# Patient Record
Sex: Female | Born: 1985 | Race: White | Smoking: Never smoker
Health system: Southern US, Community
[De-identification: ages and names within clinical notes are randomized; demographics above are authoritative.]

## PROBLEM LIST (undated history)

## (undated) DIAGNOSIS — F418 Other specified anxiety disorders: Secondary | ICD-10-CM

## (undated) DIAGNOSIS — S93402A Sprain of unspecified ligament of left ankle, initial encounter: Secondary | ICD-10-CM

## (undated) HISTORY — PX: KNEE ARTHROSCOPY: SUR90

## (undated) HISTORY — PX: TUBAL LIGATION: SHX77

## (undated) HISTORY — DX: Sprain of unspecified ligament of left ankle, initial encounter: S93.402A

## (undated) HISTORY — PX: TONSILLECTOMY: SUR1361

## (undated) HISTORY — PX: OTHER SURGICAL HISTORY: SHX169

## (undated) HISTORY — DX: Other specified anxiety disorders: F41.8

---

## 2018-08-26 ENCOUNTER — Encounter: Payer: Self-pay | Admitting: Osteopathic Medicine

## 2018-08-26 ENCOUNTER — Ambulatory Visit (INDEPENDENT_AMBULATORY_CARE_PROVIDER_SITE_OTHER): Payer: BLUE CROSS/BLUE SHIELD | Admitting: Osteopathic Medicine

## 2018-08-26 VITALS — BP 123/73 | HR 84 | Temp 97.8°F | Ht 65.0 in | Wt 160.0 lb

## 2018-08-26 DIAGNOSIS — F419 Anxiety disorder, unspecified: Secondary | ICD-10-CM

## 2018-08-26 DIAGNOSIS — F418 Other specified anxiety disorders: Secondary | ICD-10-CM | POA: Diagnosis not present

## 2018-08-26 DIAGNOSIS — F329 Major depressive disorder, single episode, unspecified: Secondary | ICD-10-CM | POA: Diagnosis not present

## 2018-08-26 DIAGNOSIS — S93402A Sprain of unspecified ligament of left ankle, initial encounter: Secondary | ICD-10-CM | POA: Insufficient documentation

## 2018-08-26 DIAGNOSIS — S93492A Sprain of other ligament of left ankle, initial encounter: Secondary | ICD-10-CM | POA: Diagnosis not present

## 2018-08-26 HISTORY — DX: Sprain of unspecified ligament of left ankle, initial encounter: S93.402A

## 2018-08-26 HISTORY — DX: Other specified anxiety disorders: F41.8

## 2018-08-26 LAB — LIPID PANEL
Cholesterol: 148 mg/dL (ref <200)
HDL: 66 mg/dL (ref >=50)
LDL Cholesterol (Calc): 67 mg/dL (calc)
Non-HDL Cholesterol (Calc): 82 mg/dL (calc) (ref <130)
Total CHOL/HDL Ratio: 2.2 (calc) (ref <5.0)
Triglycerides: 66 mg/dL (ref <150)

## 2018-08-26 LAB — COMPLETE METABOLIC PANEL WITH GFR
AG Ratio: 1.8 (calc) (ref 1.0–2.5)
ALKALINE PHOSPHATASE (APISO): 55 U/L (ref 31–125)
ALT: 32 U/L — ABNORMAL HIGH (ref 6–29)
AST: 25 U/L (ref 10–30)
Albumin: 4.6 g/dL (ref 3.6–5.1)
BILIRUBIN TOTAL: 0.8 mg/dL (ref 0.2–1.2)
BUN: 13 mg/dL (ref 7–25)
CO2: 27 mmol/L (ref 20–32)
Calcium: 9.1 mg/dL (ref 8.6–10.2)
Chloride: 102 mmol/L (ref 98–110)
Creat: 0.67 mg/dL (ref 0.50–1.10)
GFR, Est African American: 135 mL/min/{1.73_m2} (ref >=60)
GFR, Est Non African American: 116 mL/min/{1.73_m2} (ref >=60)
Globulin: 2.5 g/dL (calc) (ref 1.9–3.7)
Glucose, Bld: 91 mg/dL (ref 65–99)
Potassium: 3.9 mmol/L (ref 3.5–5.3)
SODIUM: 138 mmol/L (ref 135–146)
Total Protein: 7.1 g/dL (ref 6.1–8.1)

## 2018-08-26 LAB — CBC
HCT: 40.9 % (ref 35.0–45.0)
Hemoglobin: 14 g/dL (ref 11.7–15.5)
MCH: 30.6 pg (ref 27.0–33.0)
MCHC: 34.2 g/dL (ref 32.0–36.0)
MCV: 89.3 fL (ref 80.0–100.0)
MPV: 9.1 fL (ref 7.5–12.5)
Platelets: 285 10*3/uL (ref 140–400)
RBC: 4.58 10*6/uL (ref 3.80–5.10)
RDW: 12.7 % (ref 11.0–15.0)
WBC: 9.8 10*3/uL (ref 3.8–10.8)

## 2018-08-26 LAB — TSH: TSH: 1.05 mIU/L

## 2018-08-26 MED ORDER — ESCITALOPRAM OXALATE 10 MG PO TABS: 10.0000 mg | ORAL_TABLET | Freq: Every day | ORAL | 1 refills | Status: DC

## 2018-08-26 MED ORDER — IBUPROFEN 800 MG PO TABS: 800.0000 mg | ORAL_TABLET | Freq: Three times a day (TID) | ORAL | 2 refills | Status: DC | PRN

## 2018-08-26 NOTE — Patient Instructions (Signed)
We are starting a medication today called Lexapro to help treat your anxiety as well as depression. This is a daily medication to help control your symptoms. Try taking this in the evenings.   I also highly encourage my patients who are suffering from anxiety to seek care with a counselor or therapist. A therapist can coach you in techniques to recognize and deal with troubling thought patterns and behaviors. The ability to cope with external stressors is crucial to overall mental health. I will look into online/phone resources and get back to you on this!   Expect a call or message form this office in the next 2 weeks to check in: If you're doing well on the medicine but not feeling any effect, we can increase the dose. If you're starting to feel some effect/improvement, we can hold off on a dose increase and reevaluate at your office visit.   Let's plan to follow up in the office in 4 weeks. At that time, we can talk about how well the medicine is working for you, and we can consider increasing the dose, adding another medicine, etc.   If we are having trouble finding a good medication regimen for you, we can consult with a psychiatrist to assist with medication management.   If you experience problematic side effects, please let me know ASAP - we can switch the medicine any time, and we don't need an appointment for this.   For immediate mental health services:  [facility], [address] [place], [place] [address], [phone]  [facility], [address], [place], [place] [address], [phone]  Any emergency room  National Suicide Prevention Lifeline, [phone]   Any questions or concerns, call me!
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
## 2018-09-16 ENCOUNTER — Encounter: Payer: Self-pay | Admitting: Osteopathic Medicine

## 2018-09-16 MED ORDER — ESCITALOPRAM OXALATE 20 MG PO TABS: 20.0000 mg | ORAL_TABLET | Freq: Every day | ORAL | 0 refills | Status: DC

## 2018-09-30 ENCOUNTER — Ambulatory Visit: Payer: Self-pay | Admitting: Osteopathic Medicine

## 2018-09-30 ENCOUNTER — Ambulatory Visit: Payer: BLUE CROSS/BLUE SHIELD | Admitting: Family Medicine

## 2018-09-30 ENCOUNTER — Other Ambulatory Visit: Payer: Self-pay

## 2018-09-30 ENCOUNTER — Encounter: Payer: Self-pay | Admitting: Family Medicine

## 2018-09-30 ENCOUNTER — Ambulatory Visit (INDEPENDENT_AMBULATORY_CARE_PROVIDER_SITE_OTHER): Payer: BLUE CROSS/BLUE SHIELD

## 2018-09-30 VITALS — BP 122/79 | HR 64 | Temp 98.2°F | Wt 161.0 lb

## 2018-09-30 DIAGNOSIS — M25572 Pain in left ankle and joints of left foot: Secondary | ICD-10-CM | POA: Diagnosis not present

## 2018-09-30 DIAGNOSIS — X58XXXA Exposure to other specified factors, initial encounter: Secondary | ICD-10-CM | POA: Diagnosis not present

## 2018-09-30 DIAGNOSIS — S82892A Other fracture of left lower leg, initial encounter for closed fracture: Secondary | ICD-10-CM | POA: Diagnosis not present

## 2018-09-30 DIAGNOSIS — S82832A Other fracture of upper and lower end of left fibula, initial encounter for closed fracture: Secondary | ICD-10-CM

## 2018-09-30 NOTE — Progress Notes (Signed)
Liel Lerer is a 33 y.o. female who presents to [facility] today for left ankle pain.  Dayanira suffered an ankle inversion injury about 5 weeks ago.  She had pain and swelling at the anterior lateral aspect of the ankle. She did some limited home treatment with compression, ice and elevation along with ibuprofen. She notes continued ankle pain and swelling especially at the lateral anterior ankle.  She notes that prolonged sitting makes her ankle swell and hurt more. She works as a [employer].  She denies any fever, chills, NVD.     ROS:  As above  Exam:  BP 122/79   Pulse 64   Temp 98.2 F (36.8 C) (Oral)   Wt 161 lb (73 kg)   LMP 09/09/2018   BMI 26.79 kg/m  Wt Readings from Last 5 Encounters:  09/30/18 161 lb (73 kg)  08/26/18 160 lb (72.6 kg)   General: Well Developed, well nourished, and in no acute distress.  Neuro/Psych: Alert and oriented x3, extra-ocular muscles intact, able to move all 4 extremities, sensation grossly intact. Skin: Warm and dry, no rashes noted.  Respiratory: Not using accessory muscles, speaking in full sentences, trachea midline.  Cardiovascular: Pulses palpable, no extremity edema. Abdomen: Does not appear distended. MSK: Left ankle Minimal swelling at ATFL area. Otherwise normal appearing.  Normal ROM TTP ATFL area.  Intact strength.  Laxity and pain to anterior drawer and talar tilt.   Pulses and capillary refill intact into the foot.  Foot is nontender with normal motion.  Lab and Radiology Results No results found for this or any previous visit (from the past 72 hour(s)). Dg Ankle Complete Left  Result Date: 09/30/2018 CLINICAL DATA:  33 year old female with a history of ankle injury 5 weeks ago and lateral ankle pain EXAM: LEFT ANKLE COMPLETE - 3+ VIEW COMPARISON:  None. FINDINGS: The AP and oblique images demonstrate irregularity at the distal fibula. Ankle mortise is congruent. No  radiopaque foreign body. Joint effusion on the lateral view. Mild soft tissue swelling at the lateral ankle. IMPRESSION: Nondisplaced fracture of the distal fibula with small joint effusion. Electronically Signed   By: Gilmer Mor D.O.   On: 09/30/2018 09:00  I personally (independently) visualized and performed the interpretation of the images attached in this note.  X-ray findings personally and independently reviewed.  Patient has tiny sliver of calcification consistent with tiny avulsion fracture.  This is not the classic ankle fracture appearance per my interpretation.    Assessment and Plan: 33 y.o. female with ankle sprain or avulsion fracture at ATFL.  Plan for compression and home exercise program.  Advance activity as tolerated and recheck in 3 to 4 weeks via WebEx meeting or in person if needed.  Patient informed of results. PDMP not reviewed this encounter. Orders Placed This Encounter  Procedures  . DG Ankle Complete Left    Standing Status:   Future    Number of Occurrences:   1    Standing Expiration Date:   11/30/2019    Order Specific Question:   Reason for Exam (SYMPTOM  OR DIAGNOSIS REQUIRED)    Answer:   eval left lateral ankle pain injury 5 week ago    Order Specific Question:   Is patient pregnant?    Answer:   No    Order Specific Question:   Preferred imaging location?    Answer:   [facility]    Order Specific Question:   Radiology  Contrast Protocol - do NOT remove file path    Answer:   [email or web address]   No orders of the defined types were placed in this encounter.   Historical information moved to improve visibility of documentation.  Past Medical History:  Diagnosis Date  . Depression with anxiety 08/26/2018  . Left ankle sprain 08/26/2018   Past Surgical History:  Procedure Laterality Date  . KNEE ARTHROSCOPY Bilateral   . r hand    . TONSILLECTOMY    . TUBAL LIGATION     Social History    Tobacco Use  . Smoking status: Never Smoker  . Smokeless tobacco: Never Used  Substance Use Topics  . Alcohol use: Yes    Alcohol/week: 2.0 standard drinks    Types: 2 Glasses of wine per week    Frequency: Never   family history includes Breast cancer in her paternal aunt and sister; Hypertension in her maternal grandmother; Stroke in her maternal grandmother.  Medications: Current Outpatient Medications  Medication Sig Dispense Refill  . escitalopram (LEXAPRO) 20 MG tablet Take 1 tablet (20 mg total) by mouth daily. 90 tablet 0  . ibuprofen (ADVIL,MOTRIN) 800 MG tablet Take 1 tablet (800 mg total) by mouth every 8 (eight) hours as needed. 60 tablet 2   No current facility-administered medications for this visit.    Allergies  Allergen Reactions  . Codeine Other (See Comments)    Mental status      Discussed warning signs or symptoms. Please see discharge instructions. Patient expresses understanding.

## 2018-09-30 NOTE — Patient Instructions (Addendum)
Thank you for coming in today. Recheck in 3 weeks or so Via WebEx.   Work on ankle exercises.   Consider compression sleeve or stocking.   Consider Body Helix full ankle.    Ankle Sprain, Phase I Rehab Ask your health care provider which exercises are safe for you. Do exercises exactly as told by your health care provider and adjust them as directed. It is normal to feel mild stretching, pulling, tightness, or discomfort as you do these exercises, but you should stop right away if you feel sudden pain or your pain gets worse.Do not begin these exercises until told by your health care provider. Stretching and range of motion exercises These exercises warm up your muscles and joints and improve the movement and flexibility of your lower leg and ankle. These exercises also help to relieve pain and stiffness. Exercise A: Gastroc and soleus stretch  1. Sit on the floor with your left / right leg extended. 2. Loop a belt or towel around the ball of your left / right foot. The ball of your foot is on the walking surface, right under your toes. 3. Keep your left / right ankle and foot relaxed and keep your knee straight while you use the belt or towel to pull your foot toward you. You should feel a gentle stretch behind your calf or knee. 4. Hold this position for __________ seconds, then release to the starting position. Repeat the exercise with your knee bent. You can put a pillow or a rolled bath towel under your knee to support it. You should feel a stretch deep in your calf or at your Achilles tendon. Repeat each stretch __________ times. Complete these stretches __________ times a day. Exercise B: Ankle alphabet  1. Sit with your left / right leg supported at the lower leg. ? Do not rest your foot on anything. ? Make sure your foot has room to move freely. 2. Think of your left / right foot as a paintbrush, and move your foot to trace each letter of the alphabet in the air. Keep your hip  and knee still while you trace. Make the letters as large as you can without feeling discomfort. 3. Trace every letter from A to Z. Repeat __________ times. Complete this exercise __________ times a day. Strengthening exercises These exercises build strength and endurance in your ankle and lower leg. Endurance is the ability to use your muscles for a long time, even after they get tired. Exercise C: Dorsiflexors  1. Secure a rubber exercise band or tube to an object, such as a table leg, that will stay still when the band is pulled. Secure the other end around your left / right foot. 2. Sit on the floor facing the object, with your left / right leg extended. The band or tube should be slightly tense when your foot is relaxed. 3. Slowly bring your foot toward you, pulling the band tighter. 4. Hold this position for __________ seconds. 5. Slowly return your foot to the starting position. Repeat __________ times. Complete this exercise __________ times a day. Exercise D: Plantar flexors  1. Sit on the floor with your left / right leg extended. 2. Loop a rubber exercise tube or band around the ball of your left / right foot. The ball of your foot is on the walking surface, right under your toes. ? Hold the ends of the band or tube in your hands. ? The band or tube should be slightly tense when  your foot is relaxed. 3. Slowly point your foot and toes downward, pushing them away from you. 4. Hold this position for __________ seconds. 5. Slowly return your foot to the starting position. Repeat __________ times. Complete this exercise __________ times a day. Exercise E: Evertors 1. Sit on the floor with your legs straight out in front of you. 2. Loop a rubber exercise band or tube around the ball of your left / right foot. The ball of your foot is on the walking surface, right under your toes. ? Hold the ends of the band in your hands, or secure the band to a stable object. ? The band or tube  should be slightly tense when your foot is relaxed. 3. Slowly push your foot outward, away from your other leg. 4. Hold this position for __________ seconds. 5. Slowly return your foot to the starting position. Repeat __________ times. Complete this exercise __________ times a day. This information is not intended to replace advice given to you by your health care provider. Make sure you discuss any questions you have with your health care provider. Document Released: 01/18/2005 Document Revised: 12/04/2016 Document Reviewed: 05/03/2015 Elsevier Interactive Patient Education  2019 [employer]   Ankle Sprain, Phase II Rehab Ask your health care provider which exercises are safe for you. Do exercises exactly as told by your health care provider and adjust them as directed. It is normal to feel mild stretching, pulling, tightness, or discomfort as you do these exercises, but you should stop right away if you feel sudden pain or your pain gets worse.Do not begin these exercises until told by your health care provider. Stretching and range of motion exercises These exercises warm up your muscles and joints and improve the movement and flexibility of your lower leg and ankle. These exercises also help to relieve pain and stiffness. Exercise A: Gastroc stretch, standing  1. Stand with your hands against a wall. 2. Extend your left / right leg behind you, and bend your front knee slightly. Your heels should be on the floor. 3. Keeping your heels on the floor and your back knee straight, shift your weight toward the wall. You should feel a gentle stretch in the back of your lower leg (calf). 4. Hold this position for __________ seconds. Repeat __________ times. Complete this exercise __________ times a day. Exercise B: Soleus stretch, standing 1. Stand with your hands against a wall. 2. Extend your left / right leg behind you, and bend your front knee slightly. Both of your heels should be on the  floor. 3. Keeping your heels on the floor, bend your back knee and shift your weight slightly over your back leg. You should feel a gentle stretch deep in your calf. 4. Hold this position for __________ seconds. Repeat __________ times. Complete this exercise __________ times a day. Strengthening exercises These exercises build strength and endurance in your lower leg. Endurance is the ability to use your muscles for a long time, even after they get tired. Exercise C: Heel walking (dorsiflexion)  Walk on your heels for __________ seconds or ___________ ft. Keep your toes as high as possible. Repeat __________ times. Complete this exercise __________ times a day. Balance exercises These exercises improve your balance and the reaction and control of your ankle to help improve stability. Exercise D: Multi-angle lunge 1. Stand with your feet together. 2. Take a step forward with your left / right leg, and shift your weight onto that leg. Your back heel  will come off the floor, and your back toes will stay in place. 3. Push off your front leg to return your front foot to the starting position next to your other foot. 4. Repeat to the side, to the back, and any other directions as told by your health care provider. Repeat in each direction __________ times. Complete this exercise __________ times a day. Exercise E: Single leg stand 1. Without shoes, stand near a railing or in a door frame. Hold onto the railing or door frame as needed. 2. Stand on your left / right foot. Keep your big toe down on the floor and try to keep your arch lifted. 3. Hold this position for __________ seconds. Repeat __________ times. Complete this exercise __________ times a day. If this exercise is too easy, you can try it with your eyes closed or while standing on a pillow. Exercise F: Inversion/eversion  You will need a balance board for this exercise. Ask your health care provider where you can get a balance board or  how you can make one. 1. Stand on a non-carpeted surface near a countertop or wall. 2. Step onto the balance board so your feet are hip-width apart. 3. Keep your feet in place and keep your upper body and hips steady. Using only your feet and ankles to move the board, do one or both of the following exercises as told by your health care provider: ? Tip the board side to side as far as you can, alternating between tipping to the left and tipping to the right. If you can, tip the board so it silently taps the floor. Do not let the board forcefully hit the floor. From time to time, pause to hold a steady position. ? Tip the board side to side so the board does not hit the floor at all. From time to time, pause to hold a steady position. Repeat the movement for each exercise __________ times. Complete each exercise __________ times a day. Exercise G: Plantar flexion/dorsiflexion  You will need a balance board for this exercise. Ask your health care provider where you can get a balance board or how you can make one. 1. Stand on a non-carpeted surface near a countertop or wall. 2. Step onto the balance board so your feet are hip-width apart. 3. Keep your feet in place and keep your upper body and hips steady. Using only your feet and ankles to move the board, do one or both of the following exercises as told by your health care provider: ? Tip the board forward and backward so the board silently taps the floor. Do not let the board forcefully hit the floor. From time to time, pause to hold a steady position. ? Tip the board forward and backward so the board does not hit the floor at all. From time to time, pause to hold a steady position. Repeat the movement for each exercise __________ times. Complete each exercise __________ times a day. This information is not intended to replace advice given to you by your health care provider. Make sure you discuss any questions you have with your health care  provider. Document Released: 10/09/2005 Document Revised: 12/04/2016 Document Reviewed: 05/03/2015 Elsevier Interactive Patient Education  2019 [employer].

## 2018-12-08 ENCOUNTER — Other Ambulatory Visit: Payer: Self-pay | Admitting: Osteopathic Medicine

## 2018-12-08 NOTE — Telephone Encounter (Signed)
Forwarding to providing for review.

## 2018-12-09 NOTE — Telephone Encounter (Signed)
Needs follow-up appointment to recheck anxiety/mood on medication that we started earlier this year, virtual visit okay.  Okay to send a refill for 30 days.

## 2018-12-09 NOTE — Telephone Encounter (Signed)
Pt has been updated of med refill sent to local pharmacy. She is aware of provider's note & will return a call back to schedule a virtual appt w/provider. No other inquiries during call.

## 2019-01-09 ENCOUNTER — Encounter: Payer: Self-pay | Admitting: Osteopathic Medicine

## 2019-01-10 ENCOUNTER — Encounter: Payer: Self-pay | Admitting: Osteopathic Medicine

## 2019-01-10 ENCOUNTER — Ambulatory Visit (INDEPENDENT_AMBULATORY_CARE_PROVIDER_SITE_OTHER): Payer: BC Managed Care – PPO | Admitting: Osteopathic Medicine

## 2019-01-10 ENCOUNTER — Other Ambulatory Visit: Payer: Self-pay

## 2019-01-10 DIAGNOSIS — F329 Major depressive disorder, single episode, unspecified: Secondary | ICD-10-CM

## 2019-01-10 DIAGNOSIS — S82892A Other fracture of left lower leg, initial encounter for closed fracture: Secondary | ICD-10-CM

## 2019-01-10 DIAGNOSIS — F419 Anxiety disorder, unspecified: Secondary | ICD-10-CM

## 2019-01-10 MED ORDER — ESCITALOPRAM OXALATE 20 MG PO TABS: 20.0000 mg | ORAL_TABLET | Freq: Every day | ORAL | 3 refills | Status: DC

## 2019-01-10 NOTE — Progress Notes (Signed)
Virtual Visit via Video (App used: Doximity) Note  I connected with      Paige Torres on 01/10/19 at 9:10 AM by a telemedicine application and verified that I am speaking with the correct person using two identifiers.  Patient is at home I am in office   I discussed the limitations of evaluation and management by telemedicine and the availability of in person appointments. The patient expressed understanding and agreed to proceed.  History of Present Illness: Paige Torres is a 33 y.o. female who would like to discuss medication refills    MyChart message sent yesterday, asking for refills, will be uninsured for awhile as she will be starting a new job on Monday.  Overall, the Lexapro is working really well for her.  She also had some blood work done earlier this year in anticipation of an annual physical but due to COVID problems this had to be canceled.  She is still due for a Pap smear but would like to hold off until she has insurance again.   She reports seeing Dr. Denyse Amassorey not too long ago for avulsion fracture of ankle, she states that overall it is a lot better but she is still not quite recovered and having soreness.  She reports participating in home exercises as directed and occasionally will wear an ankle brace.  [facility] 2/9 01/10/2019 09/30/2018  Decreased Interest 0 0  Down, Depressed, Hopeless 1 1  PHQ - 2 Score 1 1  Altered sleeping 1 1  Tired, decreased energy 1 0  Change in appetite 0 0  Feeling bad or failure about yourself  0 0  Trouble concentrating 0 0  Moving slowly or fidgety/restless 0 0  Suicidal thoughts 0 0  PHQ-9 Score 3 2  Difficult doing work/chores Not difficult at all Not difficult at all   GAD 7 : Generalized Anxiety Score 01/10/2019 09/30/2018  Nervous, Anxious, on Edge 1 1  Control/stop worrying 1 0  Worry too much - different things 1 0  Trouble relaxing 1 1  Restless 0 0  Easily annoyed or irritable 0 0  Afraid - awful might  happen 1 0  Total GAD 7 Score 5 2  Anxiety Difficulty Not difficult at all Not difficult at all          Observations/Objective: There were no vitals taken for this visit. BP Readings from Last 3 Encounters:  09/30/18 122/79  08/26/18 123/73   Exam: Normal Speech.  NAD  Lab and Radiology Results No results found for this or any previous visit (from the past 72 hour(s)). No results found.     Assessment and Plan: 33 y.o. female with The primary encounter diagnosis was Anxiety and depression. A diagnosis of Closed avulsion fracture of left ankle, initial encounter was also pertinent to this visit.  Okay to refill medications, patient advised to come see me sometime this fall for routine Pap smear/annual physical.  Advised continuation of rehab exercises, bone healing can be fairly fast but ligaments and soft tissues and tendons may take some more time.  Recommend continued/consistent wrapping of the ankle if needed.  I will route a message to Dr. Denyse Amassorey to see if he has anything else to add.  PDMP not reviewed this encounter. No orders of the defined types were placed in this encounter.  Meds ordered this encounter  Medications  . escitalopram (LEXAPRO) 20 MG tablet    Sig: Take 1 tablet (20 mg total) by mouth daily.  Dispense:  90 tablet    Refill:  3      Follow Up Instructions: Return for Annual physical/Pap smear sometime this fall.    I discussed the assessment and treatment plan with the patient. The patient was provided an opportunity to ask questions and all were answered. The patient agreed with the plan and demonstrated an understanding of the instructions.   The patient was advised to call back or seek an in-person evaluation if any new concerns, if symptoms worsen or if the condition fails to improve as anticipated.  25 minutes of non-face-to-face time was provided during this encounter.                      Historical  information moved to improve visibility of documentation.  Past Medical History:  Diagnosis Date  . Depression with anxiety 08/26/2018  . Left ankle sprain 08/26/2018   Past Surgical History:  Procedure Laterality Date  . KNEE ARTHROSCOPY Bilateral   . r hand    . TONSILLECTOMY    . TUBAL LIGATION     Social History   Tobacco Use  . Smoking status: Never Smoker  . Smokeless tobacco: Never Used  Substance Use Topics  . Alcohol use: Yes    Alcohol/week: 2.0 standard drinks    Types: 2 Glasses of wine per week    Frequency: Never   family history includes Breast cancer in her paternal aunt and sister; Hypertension in her maternal grandmother; Stroke in her maternal grandmother.  Medications: Current Outpatient Medications  Medication Sig Dispense Refill  . escitalopram (LEXAPRO) 20 MG tablet Take 1 tablet (20 mg total) by mouth daily. 90 tablet 3  . ibuprofen (ADVIL,MOTRIN) 800 MG tablet Take 1 tablet (800 mg total) by mouth every 8 (eight) hours as needed. 60 tablet 2   No current facility-administered medications for this visit.    Allergies  Allergen Reactions  . Codeine Other (See Comments)    Mental status    PDMP not reviewed this encounter. No orders of the defined types were placed in this encounter.  Meds ordered this encounter  Medications  . escitalopram (LEXAPRO) 20 MG tablet    Sig: Take 1 tablet (20 mg total) by mouth daily.    Dispense:  90 tablet    Refill:  3

## 2019-10-08 ENCOUNTER — Encounter: Payer: Self-pay | Admitting: Medical-Surgical

## 2020-01-22 ENCOUNTER — Other Ambulatory Visit: Payer: Self-pay | Admitting: Osteopathic Medicine

## 2020-05-17 ENCOUNTER — Encounter: Payer: Self-pay | Admitting: Osteopathic Medicine

## 2020-05-17 ENCOUNTER — Other Ambulatory Visit: Payer: Self-pay | Admitting: *Deleted

## 2020-05-17 MED ORDER — ESCITALOPRAM OXALATE 20 MG PO TABS: 20.0000 mg | ORAL_TABLET | Freq: Every day | ORAL | 0 refills | Status: DC

## 2020-05-24 ENCOUNTER — Other Ambulatory Visit: Payer: Self-pay | Admitting: Osteopathic Medicine

## 2020-05-25 ENCOUNTER — Telehealth (INDEPENDENT_AMBULATORY_CARE_PROVIDER_SITE_OTHER): Payer: BC Managed Care – PPO | Admitting: Osteopathic Medicine

## 2020-05-25 ENCOUNTER — Encounter: Payer: Self-pay | Admitting: Osteopathic Medicine

## 2020-05-25 VITALS — Wt 177.0 lb

## 2020-05-25 DIAGNOSIS — F419 Anxiety disorder, unspecified: Secondary | ICD-10-CM

## 2020-05-25 DIAGNOSIS — Z0000 Encounter for general adult medical examination without abnormal findings: Secondary | ICD-10-CM

## 2020-05-25 DIAGNOSIS — F32A Depression, unspecified: Secondary | ICD-10-CM | POA: Diagnosis not present

## 2020-05-25 MED ORDER — ESCITALOPRAM OXALATE 20 MG PO TABS: 20.0000 mg | ORAL_TABLET | Freq: Every day | ORAL | 3 refills | Status: DC

## 2020-05-25 NOTE — Progress Notes (Signed)
Virtual Visit via Video (App used: MyChart) Note  I connected with      Paige Torres on 05/25/20 at 7:38 AM  by a telemedicine application and verified that I am speaking with the correct person using two identifiers.  Patient is at work I am in office   I discussed the limitations of evaluation and management by telemedicine and the availability of in person appointments. The patient expressed understanding and agreed to proceed.  History of Present Illness: Paige Torres is a 34 y.o. female who would like to discuss continuation of Lexapro, needs to schedule annual / labs, worried about hypersomnia. Overall mentally feeling well on Lexapro 20 mg.       Depression screen [facility] 2/9 05/25/2020 01/10/2019 09/30/2018  Decreased Interest 0 0 0  Down, Depressed, Hopeless 0 1 1  PHQ - 2 Score 0 1 1  Altered sleeping 3 1 1   Tired, decreased energy 3 1 0  Change in appetite 0 0 0  Feeling bad or failure about yourself  1 0 0  Trouble concentrating 0 0 0  Moving slowly or fidgety/restless 0 0 0  Suicidal thoughts 0 0 0  PHQ-9 Score 7 3 2   Difficult doing work/chores Somewhat difficult Not difficult at all Not difficult at all   GAD 7 : Generalized Anxiety Score 05/25/2020 01/10/2019 09/30/2018  Nervous, Anxious, on Edge 1 1 1   Control/stop worrying 1 1 0  Worry too much - different things 1 1 0  Trouble relaxing 1 1 1   Restless 0 0 0  Easily annoyed or irritable 1 0 0  Afraid - awful might happen 0 1 0  Total GAD 7 Score 5 5 2   Anxiety Difficulty Somewhat difficult Not difficult at all Not difficult at all       Observations/Objective: Wt 177 lb (80.3 kg)   LMP 05/24/2020   BMI 29.45 kg/m  BP Readings from Last 3 Encounters:  09/30/18 122/79  08/26/18 123/73   Exam: Normal Speech.  NAD  Lab and Radiology Results No results found for this or any previous visit (from the past 72 hour(s)). No results found.     Assessment and Plan: 34 y.o. female with The  primary encounter diagnosis was Anxiety and depression. A diagnosis of Annual physical exam was also pertinent to this visit.  Labs ordered for future visit. Annual physical / preventive care was NOT performed or billed today.   PDMP not reviewed this encounter. No orders of the defined types were placed in this encounter.  Meds ordered this encounter  Medications  . escitalopram (LEXAPRO) 20 MG tablet    Sig: Take 1 tablet (20 mg total) by mouth daily.    Dispense:  90 tablet    Refill:  3   Patient Instructions  Plan:  Sleepiness - let's try reducing the Lexapro form 20 mg to 10 mg for a few weeks, let's see if that helps. Will also be checking labs w/ annual visit.   Orders are in for annual lab work! Please get fasting blood drawn at least 2 days before your appointment.  (No food or anything to drink besides water or black coffee, 8 hours prior to blood draw. Take all medications as usual).  Lab is in room 135 in the [facility] building. Open 7-5 weekdays, no appointment needed.       Instructions sent via MyChart. If MyChart not available, pt was given option for info via personal e-mail w/ no guarantee of  protected health info over unsecured e-mail communication, and MyChart sign-up instructions were sent to patient.   Follow Up Instructions: Return for ANNUAL (labs 2+ days prior to appt, orders are in).    I discussed the assessment and treatment plan with the patient. The patient was provided an opportunity to ask questions and all were answered. The patient agreed with the plan and demonstrated an understanding of the instructions.   The patient was advised to call back or seek an in-person evaluation if any new concerns, if symptoms worsen or if the condition fails to improve as anticipated.  20 minutes of non-face-to-face time was provided during this encounter.      . . . . . . . . . . . . . Marland Kitchen                   Historical  information moved to improve visibility of documentation.  Past Medical History:  Diagnosis Date  . Depression with anxiety 08/26/2018  . Left ankle sprain 08/26/2018   Past Surgical History:  Procedure Laterality Date  . KNEE ARTHROSCOPY Bilateral   . r hand    . TONSILLECTOMY    . TUBAL LIGATION     Social History   Tobacco Use  . Smoking status: Never Smoker  . Smokeless tobacco: Never Used  Substance Use Topics  . Alcohol use: Yes    Alcohol/week: 2.0 standard drinks    Types: 2 Glasses of wine per week   family history includes Breast cancer in her paternal aunt and sister; Hypertension in her maternal grandmother; Stroke in her maternal grandmother.  Medications: Current Outpatient Medications  Medication Sig Dispense Refill  . escitalopram (LEXAPRO) 20 MG tablet Take 1 tablet (20 mg total) by mouth daily. 90 tablet 3   No current facility-administered medications for this visit.   Allergies  Allergen Reactions  . Codeine Other (See Comments)    Mental status

## 2020-05-25 NOTE — Patient Instructions (Signed)
Plan:  Sleepiness - let's try reducing the Lexapro form 20 mg to 10 mg for a few weeks, let's see if that helps. Will also be checking labs w/ annual visit.   Orders are in for annual lab work! Please get fasting blood drawn at least 2 days before your appointment.  (No food or anything to drink besides water or black coffee, 8 hours prior to blood draw. Take all medications as usual).  Lab is in room 135 in the [facility] building. Open 7-5 weekdays, no appointment needed.

## 2020-06-28 ENCOUNTER — Encounter: Payer: Self-pay | Admitting: Osteopathic Medicine

## 2020-08-18 ENCOUNTER — Encounter: Payer: BC Managed Care – PPO | Admitting: Osteopathic Medicine

## 2020-09-01 ENCOUNTER — Telehealth: Payer: Self-pay

## 2020-09-01 NOTE — Telephone Encounter (Signed)
Called twice couldn't leave a voice mail message will continue to call patient to get an appt.

## 2020-09-01 NOTE — Telephone Encounter (Signed)
Pt left a vm msg requesting antibiotics for UTI symptoms. Please contact the patient and schedule an appointment to be evaluated. Thanks in advance.

## 2020-09-02 ENCOUNTER — Encounter: Payer: Self-pay | Admitting: Osteopathic Medicine

## 2020-09-07 ENCOUNTER — Other Ambulatory Visit: Payer: Self-pay

## 2020-09-07 ENCOUNTER — Encounter: Payer: Self-pay | Admitting: Osteopathic Medicine

## 2020-09-07 ENCOUNTER — Other Ambulatory Visit (HOSPITAL_COMMUNITY)
Admission: RE | Admit: 2020-09-07 | Discharge: 2020-09-07 | Disposition: A | Discharge status: Home / Self Care | Payer: BC Managed Care – PPO | Source: Ambulatory Visit | Attending: Osteopathic Medicine | Admitting: Osteopathic Medicine

## 2020-09-07 ENCOUNTER — Ambulatory Visit (INDEPENDENT_AMBULATORY_CARE_PROVIDER_SITE_OTHER): Payer: BC Managed Care – PPO | Admitting: Osteopathic Medicine

## 2020-09-07 VITALS — BP 122/72 | HR 94 | Temp 98.8°F | Wt 184.0 lb

## 2020-09-07 DIAGNOSIS — Z803 Family history of malignant neoplasm of breast: Secondary | ICD-10-CM | POA: Diagnosis not present

## 2020-09-07 DIAGNOSIS — N3000 Acute cystitis without hematuria: Secondary | ICD-10-CM

## 2020-09-07 DIAGNOSIS — Z0000 Encounter for general adult medical examination without abnormal findings: Secondary | ICD-10-CM | POA: Diagnosis not present

## 2020-09-07 DIAGNOSIS — Z124 Encounter for screening for malignant neoplasm of cervix: Secondary | ICD-10-CM

## 2020-09-07 NOTE — Patient Instructions (Addendum)
General Preventive Care  Most recent routine screening labs: ordered.   Blood pressure goal 130/80 or less.   Tobacco: don't!   Alcohol: responsible moderation is ok for most adults - if you have concerns about your alcohol intake, please talk to me!   Exercise: as tolerated to reduce risk of cardiovascular disease and diabetes. Strength training will also prevent osteoporosis.   Mental health: if need for mental health care (medicines, counseling, other), or concerns about moods, please let me know!   Sexual / Reproductive health: if need for STD testing, or if concerns with libido/pain problems, please let me know!  Advanced Directive: Living Will and/or Healthcare Power of Attorney recommended for all adults, regardless of age or health.  Vaccines  Flu vaccine: for almost everyone, every fall.   Shingles vaccine: after age 58.   Pneumonia vaccines: after age 41.  Tetanus booster: every 10 years / 3rd trimester pregnancy  HPV vaccine: Gardasil up to age 43  COVID vaccine: STRONGLY RECOMMENDED  Cancer screenings   Colon cancer screening: for everyone age 90-75.  Breast cancer screening: mammogram at age 66   Cervical cancer screening: Pap every 5 years if normal  Lung cancer screening: not needed for non-smokers  Infection screenings  . HIV: recommended screening at least once age 37-65 . Gonorrhea/Chlamydia: screening as needed . Hepatitis C: recommended once for everyone age 95-75 . TB: certain at-risk populations, or depending on work requirements and/or travel history  Other . Bone Density Test: recommended at age 27

## 2020-09-07 NOTE — Progress Notes (Signed)
Paige Torres is a 35 y.o. female who presents to  [facility]  today, 09/07/20, seeking care for the following:  . Annual Physical  . Thinks getting over UTI      ASSESSMENT & PLAN with other pertinent findings:  The primary encounter diagnosis was Annual physical exam. Diagnoses of Cervical cancer screening, Acute cystitis without hematuria, and Family history of breast cancer in sister were also pertinent to this visit.    Patient Instructions  General Preventive Care  Most recent routine screening labs: ordered.   Blood pressure goal 130/80 or less.   Tobacco: don't!   Alcohol: responsible moderation is ok for most adults - if you have concerns about your alcohol intake, please talk to me!   Exercise: as tolerated to reduce risk of cardiovascular disease and diabetes. Strength training will also prevent osteoporosis.   Mental health: if need for mental health care (medicines, counseling, other), or concerns about moods, please let me know!   Sexual / Reproductive health: if need for STD testing, or if concerns with libido/pain problems, please let me know!  Advanced Directive: Living Will and/or Healthcare Power of Attorney recommended for all adults, regardless of age or health.  Vaccines  Flu vaccine: for almost everyone, every fall.   Shingles vaccine: after age 41.   Pneumonia vaccines: after age 110.  Tetanus booster: every 10 years / 3rd trimester pregnancy  HPV vaccine: Gardasil up to age 47  COVID vaccine: STRONGLY RECOMMENDED  Cancer screenings   Colon cancer screening: for everyone age 31-75.  Breast cancer screening: mammogram at age 78   Cervical cancer screening: Pap every 5 years if normal  Lung cancer screening: not needed for non-smokers  Infection screenings  . HIV: recommended screening at least once age 1-65 . Gonorrhea/Chlamydia: screening as needed . Hepatitis C: recommended  once for everyone age 14-75 . TB: certain at-risk populations, or depending on work requirements and/or travel history  Other . Bone Density Test: recommended at age 96   Orders Placed This Encounter  Procedures  . MM 3D SCREEN BREAST BILATERAL  . CBC  . COMPLETE METABOLIC PANEL WITH GFR  . Lipid panel  . Urinalysis with Culture Reflex    No orders of the defined types were placed in this encounter.    See below for relevant physical exam findings  See below for recent lab and imaging results reviewed  Medications, allergies, PMH, PSH, SocH, FamH reviewed below    Follow-up instructions: Return in about 1 year (around 09/07/2021) for Daisy (call week prior to visit for lab orders if blood work desired ahead of time).                                        Exam:  BP 122/72 (BP Location: Left Arm, Patient Position: Sitting, Cuff Size: Normal)   Pulse 94   Temp 98.8 F (37.1 C) (Oral)   Wt 184 lb 0.6 oz (83.5 kg)   BMI 30.63 kg/m   Constitutional: VS see above. General Appearance: alert, well-developed, well-nourished, NAD  Neck: No masses, trachea midline.   Respiratory: Normal respiratory effort. no wheeze, no rhonchi, no rales  Cardiovascular: S1/S2 normal, no murmur, no rub/gallop auscultated. RRR.   Musculoskeletal: Gait normal. Symmetric and independent movement of all extremities  Abdominal: non-tender, non-distended, no appreciable organomegaly, neg Murphy's, BS [id number]  Neurological: Normal balance/coordination. No tremor.  Skin: warm, dry, intact.   Psychiatric: Normal judgment/insight. Normal mood and affect. Oriented x3.  GYN: No lesions/ulcers to external genitalia, normal urethra, normal vaginal mucosa, physiologic discharge, cervix normal without lesions, uterus not enlarged or tender, adnexa no masses and nontender  BREAST: No rashes/skin changes, normal fibrous breast tissue, no masses or tenderness, normal nipple  without discharge, normal axilla   Current Meds  Medication Sig  . escitalopram (LEXAPRO) 20 MG tablet Take 1 tablet (20 mg total) by mouth daily.    Allergies  Allergen Reactions  . Codeine Other (See Comments)    Mental status    Patient Active Problem List   Diagnosis Date Noted  . Family history of breast cancer in sister 09/07/2020  . Depression with anxiety 08/26/2018  . Left ankle sprain 08/26/2018    Family History  Problem Relation Age of Onset  . Breast cancer Sister        tested and not genetic, patient had previous mammo normal exapt increased breast density   . Breast cancer Paternal Aunt   . Stroke Maternal Grandmother   . Hypertension Maternal Grandmother     Social History   Tobacco Use  Smoking Status Never Smoker  Smokeless Tobacco Never Used    Past Surgical History:  Procedure Laterality Date  . KNEE ARTHROSCOPY Bilateral   . r hand    . TONSILLECTOMY    . TUBAL LIGATION       There is no immunization history on file for this patient.  No results found for this or any previous visit (from the past 2160 hour(s)).  No results found.     All questions at time of visit were answered - patient instructed to contact office with any additional concerns or updates. ER/RTC precautions were reviewed with the patient as applicable.   Please note: manual typing as well as voice recognition software may have been used to produce this document - typos may escape review. Please contact Dr. Lyn Hollingshead for any needed clarifications.

## 2020-09-08 LAB — COMPLETE METABOLIC PANEL WITH GFR
AG Ratio: 2 (calc) (ref 1.0–2.5)
ALT: 12 U/L (ref 6–29)
AST: 14 U/L (ref 10–30)
Albumin: 4.7 g/dL (ref 3.6–5.1)
Alkaline phosphatase (APISO): 62 U/L (ref 31–125)
BUN: 12 mg/dL (ref 7–25)
CO2: 27 mmol/L (ref 20–32)
Calcium: 9.5 mg/dL (ref 8.6–10.2)
Chloride: 105 mmol/L (ref 98–110)
Creat: 0.79 mg/dL (ref 0.50–1.10)
GFR, Est African American: 113 mL/min/{1.73_m2} (ref >=60)
GFR, Est Non African American: 98 mL/min/{1.73_m2} (ref >=60)
Globulin: 2.4 g/dL (calc) (ref 1.9–3.7)
Glucose, Bld: 76 mg/dL (ref 65–139)
Potassium: 4.9 mmol/L (ref 3.5–5.3)
Sodium: 140 mmol/L (ref 135–146)
Total Bilirubin: 0.6 mg/dL (ref 0.2–1.2)
Total Protein: 7.1 g/dL (ref 6.1–8.1)

## 2020-09-08 LAB — URINALYSIS W MICROSCOPIC + REFLEX CULTURE
Bacteria, UA: NONE SEEN /HPF
Bilirubin Urine: NEGATIVE
Glucose, UA: NEGATIVE
Hgb urine dipstick: NEGATIVE
Hyaline Cast: NONE SEEN /LPF
Ketones, ur: NEGATIVE
Leukocyte Esterase: NEGATIVE
Nitrites, Initial: NEGATIVE
Protein, ur: NEGATIVE
RBC / HPF: NONE SEEN /HPF (ref 0–2)
Specific Gravity, Urine: 1.024 (ref 1.001–1.03)
WBC, UA: NONE SEEN /HPF (ref 0–5)
pH: 8 (ref 5.0–8.0)

## 2020-09-08 LAB — NO CULTURE INDICATED

## 2020-09-08 LAB — CBC
HCT: 41.4 % (ref 35.0–45.0)
Hemoglobin: 14 g/dL (ref 11.7–15.5)
MCH: 29.9 pg (ref 27.0–33.0)
MCHC: 33.8 g/dL (ref 32.0–36.0)
MCV: 88.5 fL (ref 80.0–100.0)
MPV: 9.2 fL (ref 7.5–12.5)
Platelets: 274 10*3/uL (ref 140–400)
RBC: 4.68 10*6/uL (ref 3.80–5.10)
RDW: 12.9 % (ref 11.0–15.0)
WBC: 8 10*3/uL (ref 3.8–10.8)

## 2020-09-08 LAB — LIPID PANEL
Cholesterol: 165 mg/dL (ref <200)
HDL: 61 mg/dL (ref >=50)
LDL Cholesterol (Calc): 84 mg/dL (calc)
Non-HDL Cholesterol (Calc): 104 mg/dL (calc) (ref <130)
Total CHOL/HDL Ratio: 2.7 (calc) (ref <5.0)
Triglycerides: 104 mg/dL (ref <150)

## 2020-09-10 LAB — CYTOLOGY - PAP
Comment: NEGATIVE
Diagnosis: NEGATIVE
Diagnosis: REACTIVE
High risk HPV: NEGATIVE

## 2020-09-10 MED ORDER — METRONIDAZOLE 500 MG PO TABS: 500.0000 mg | ORAL_TABLET | Freq: Two times a day (BID) | ORAL | 0 refills | Status: AC

## 2020-09-10 NOTE — Addendum Note (Signed)
Addended by: Deirdre Pippins on: 09/10/2020 11:34 AM   Modules accepted: Orders

## 2021-03-25 DIAGNOSIS — F4310 Post-traumatic stress disorder, unspecified: Secondary | ICD-10-CM | POA: Diagnosis not present

## 2021-03-25 DIAGNOSIS — F339 Major depressive disorder, recurrent, unspecified: Secondary | ICD-10-CM | POA: Diagnosis not present

## 2021-06-20 ENCOUNTER — Other Ambulatory Visit: Payer: Self-pay | Admitting: Osteopathic Medicine

## 2021-09-08 ENCOUNTER — Encounter: Payer: BC Managed Care – PPO | Admitting: Osteopathic Medicine

## 2021-09-23 ENCOUNTER — Other Ambulatory Visit: Payer: Self-pay | Admitting: Sports Medicine

## 2021-10-07 ENCOUNTER — Emergency Department (INDEPENDENT_AMBULATORY_CARE_PROVIDER_SITE_OTHER): Payer: 59

## 2021-10-07 ENCOUNTER — Emergency Department
Admission: RE | Admit: 2021-10-07 | Discharge: 2021-10-07 | Disposition: A | Discharge status: Home / Self Care | Payer: 59 | Source: Ambulatory Visit

## 2021-10-07 ENCOUNTER — Other Ambulatory Visit: Payer: Self-pay

## 2021-10-07 VITALS — BP 112/77 | HR 75 | Temp 98.9°F | Resp 16 | Ht 64.0 in | Wt 180.0 lb

## 2021-10-07 DIAGNOSIS — S82892B Other fracture of left lower leg, initial encounter for open fracture type I or II: Secondary | ICD-10-CM

## 2021-10-07 DIAGNOSIS — S8255XA Nondisplaced fracture of medial malleolus of left tibia, initial encounter for closed fracture: Secondary | ICD-10-CM

## 2021-10-07 DIAGNOSIS — S99912A Unspecified injury of left ankle, initial encounter: Secondary | ICD-10-CM | POA: Diagnosis not present

## 2021-10-07 MED ORDER — IBUPROFEN 800 MG PO TABS: 800.0000 mg | ORAL_TABLET | Freq: Two times a day (BID) | ORAL | 0 refills | Status: DC | PRN
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
## 2021-10-07 NOTE — Discharge Instructions (Addendum)
Advised patient to wear left cam walker boot 24/7 (except when bathing) untill being evaluated by orthopedic provider.  [facility] orthopedic provider contact information provided above with this AVS today.  Instructed patient to call this provider's office first thing Monday morning, 10/10/2021.  Advised patient may take Ibuprofen 800 mg 1-2 times daily, as needed for left ankle pain advised patient may RICE left ankle 25 minutes 3 times daily for the next 2 to 3 days for left ankle pain and swelling. ?

## 2021-10-11 ENCOUNTER — Ambulatory Visit (INDEPENDENT_AMBULATORY_CARE_PROVIDER_SITE_OTHER): Payer: 59 | Admitting: Family Medicine

## 2021-10-11 ENCOUNTER — Telehealth: Payer: Self-pay | Admitting: Emergency Medicine

## 2021-10-11 ENCOUNTER — Encounter: Payer: Self-pay | Admitting: Family Medicine

## 2021-10-11 VITALS — BP 110/72 | Ht 64.0 in | Wt 180.0 lb

## 2021-10-11 DIAGNOSIS — S8255XA Nondisplaced fracture of medial malleolus of left tibia, initial encounter for closed fracture: Secondary | ICD-10-CM

## 2021-10-11 NOTE — Assessment & Plan Note (Signed)
Acutely occurring.  Initial injury on 4/7.  Does have mild displacement of the medial malleolus on imaging.  Is neurovascularly intact. ?-Counseled on home exercise therapy and supportive care. ?-Counseled on nonweightbearing and continue the cam walker. ?-Referral to orthopedics. ?

## 2021-10-11 NOTE — Telephone Encounter (Signed)
Contacted patient regarding her coming back in to sign for the crutches per Chrys Racer Reed Breech).  Patient states that she will come in tomorrow and sign paperwork for crutches. ?
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
## 2021-10-11 NOTE — Patient Instructions (Signed)
Nice to meet you ?Please use ice as needed ?Please continue crutches and boot  ?We have made the referral   ?Please send me a message in MyChart with any questions or updates.  ?Please see me back as needed.  ? ?--Dr. Jordan Likes ? ?

## 2021-10-12 ENCOUNTER — Telehealth: Payer: Self-pay | Admitting: Emergency Medicine

## 2021-10-12 NOTE — Telephone Encounter (Signed)
Follow up call to [facility] to get verbal consent for crutches. Ok per pt to sign off on Don joy - crutches were not added at original visit- RN also updated Reed Breech rep via phone.  ?

## 2021-10-20 ENCOUNTER — Encounter: Payer: Self-pay | Admitting: Family Medicine

## 2021-10-20 ENCOUNTER — Ambulatory Visit: Payer: 59 | Admitting: Family Medicine

## 2021-10-20 DIAGNOSIS — F418 Other specified anxiety disorders: Secondary | ICD-10-CM

## 2021-10-20 MED ORDER — ESCITALOPRAM OXALATE 10 MG PO TABS: 10.0000 mg | ORAL_TABLET | Freq: Every day | ORAL | 1 refills | Status: DC
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
## 2021-10-20 NOTE — Assessment & Plan Note (Signed)
She would like to try backing down on lexapro to 10mg  daily.  Updated rx sent in.  F/u in 3 months.  ?

## 2022-01-19 ENCOUNTER — Ambulatory Visit: Payer: 59 | Admitting: Family Medicine

## 2022-01-19 ENCOUNTER — Encounter: Payer: Self-pay | Admitting: Family Medicine

## 2022-01-19 DIAGNOSIS — F418 Other specified anxiety disorders: Secondary | ICD-10-CM | POA: Diagnosis not present

## 2022-01-19 NOTE — Assessment & Plan Note (Signed)
Will continue to wean from lexapro.  Given instructions on how to wean from this.  She will let me know if she needs to add this back on.  Return in about 6 months (around 07/22/2022) for GAD.

## 2022-01-19 NOTE — Patient Instructions (Addendum)
Decrease lexapro to 1/2 tab daily x2 weeks, then 1/2 tab every other day x 2 weeks then stop.   Let me know if you feel like you nee to add this back on.  See me again in 6 months.

## 2022-01-19 NOTE — Progress Notes (Signed)
Paige Torres - 36 y.o. female MRN [id number]  Date of birth: 1986/03/18  Subjective Chief Complaint  Patient presents with   Mood    HPI Paige Torres is a 36 y.o. female here today for follow up for anxiety.  Her lexapro was decreased to 10mg  at last visit.  She reports that she is doing well with decrease.  She continues to tolerate lexapro well. She would like to continue to wean from this.    Previous fracture has healed well.  Denies continued pain at this time.   ROS:  A comprehensive ROS was completed and negative except as noted per HPI   Allergies  Allergen Reactions   Codeine Other (See Comments)    Mental status    Past Medical History:  Diagnosis Date   Depression with anxiety 08/26/2018   Left ankle sprain 08/26/2018    Past Surgical History:  Procedure Laterality Date   KNEE ARTHROSCOPY Bilateral    r hand     TONSILLECTOMY     TUBAL LIGATION      Social History   Socioeconomic History   Marital status: Divorced    Spouse name: Not on file   Number of children: Not on file   Years of education: Not on file   Highest education level: Not on file  Occupational History   Occupation: truck driver  Tobacco Use   Smoking status: Never   Smokeless tobacco: Never  Vaping Use   Vaping Use: Never used  Substance and Sexual Activity   Alcohol use: Yes    Alcohol/week: 2.0 standard drinks of alcohol    Types: 2 Glasses of wine per week   Drug use: Never   Sexual activity: Yes    Birth control/protection: Surgical  Other Topics Concern   Not on file  Social History Narrative   Not on file   Social Determinants of Health   Financial Resource Strain: Not on file  Food Insecurity: Not on file  Transportation Needs: Not on file  Physical Activity: Not on file  Stress: Not on file  Social Connections: Not on file    Family History  Problem Relation Age of Onset   Arthritis Mother    Diabetes Father    Sleep apnea Father    Breast cancer Sister         tested and not genetic, patient had previous mammo normal exapt increased breast density    Stroke Maternal Grandmother    Hypertension Maternal Grandmother    Breast cancer Paternal Aunt     Health Maintenance  Topic Date Due   HIV Screening  Never done   Hepatitis C Screening  Never done   TETANUS/TDAP  Never done   COVID-19 Vaccine (1) 03/03/2022 (Originally 04/25/1986)   INFLUENZA VACCINE  01/31/2022   PAP SMEAR-Modifier  09/08/2023   HPV VACCINES  Aged Out     ----------------------------------------------------------------------------------------------------------------------------------------------------------------------------------------------------------------- Physical Exam BP 98/66 (BP Location: Left Arm, Patient Position: Sitting, Cuff Size: Normal)   Pulse 69   Ht 5\' 4"  (1.626 m)   Wt 193 lb (87.5 kg)   SpO2 98%   BMI 33.13 kg/m   Physical Exam Constitutional:      Appearance: Normal appearance.  Eyes:     General: No scleral icterus. Musculoskeletal:     Cervical back: Neck supple.  Neurological:     Mental Status: She is alert.  Psychiatric:        Mood and Affect: Mood normal.  Behavior: Behavior normal.     ------------------------------------------------------------------------------------------------------------------------------------------------------------------------------------------------------------------- Assessment and Plan  Depression with anxiety Will continue to wean from lexapro.  Given instructions on how to wean from this.  She will let me know if she needs to add this back on.  Return in about 6 months (around 07/22/2022) for GAD.   No orders of the defined types were placed in this encounter.   Return in about 6 months (around 07/22/2022) for GAD.    This visit occurred during the SARS-CoV-2 public health emergency.  Safety protocols were in place, including screening questions prior to the visit, additional  usage of staff PPE, and extensive cleaning of exam room while observing appropriate contact time as indicated for disinfecting solutions.

## 2022-07-10 ENCOUNTER — Encounter: Payer: Self-pay | Admitting: Family Medicine

## 2022-07-10 ENCOUNTER — Ambulatory Visit: Payer: 59 | Admitting: Family Medicine

## 2022-07-10 VITALS — BP 114/71 | HR 78 | Temp 98.6°F | Ht 64.5 in | Wt 195.3 lb

## 2022-07-10 DIAGNOSIS — N6325 Unspecified lump in the left breast, overlapping quadrants: Secondary | ICD-10-CM

## 2022-07-10 DIAGNOSIS — N644 Mastodynia: Secondary | ICD-10-CM

## 2022-07-10 MED ORDER — NAPROXEN 500 MG PO TABS: 500.0000 mg | ORAL_TABLET | Freq: Two times a day (BID) | ORAL | 0 refills | Status: DC | PRN

## 2022-07-10 NOTE — Progress Notes (Signed)
Acute Office Visit  Subjective:     Patient ID: Paige Torres, female    DOB: [birth year], 37 y.o.   MRN: [id number]  Chief Complaint  Patient presents with   Breast Mass    Patient in office c/o left breast mass  little bigger than marble in size- location around 8 or 9 on clock face x  noticed Saturday night  07/08/22.  History of Known cysts Right side breast that are tender during menstruation- strong paternal history of breast cancer ( was tested and found not to be genetic)  and sister dx with stage 3 breast cancer  at age 19 years of age ( about 10 years ago)     HPI Patient is in today for acute visit.  Pt reports her sister has a sister who was 14 y.o and had breast cancer. She reports it was stage 3. She has had chemo and radiation with double mastectomy with lymph node excision. She also has paternal aunt (54) stage 2 with double mastectomy and great-grandmother (90) who was diagnosed with breast cancer.   She has had hx of breast pain in the past. She had lump on the right back in 2015 and had diagnostic mammogram done with ultrasound which just revealed very dense breasts but no other lumps. She does report sensitive breasts around her periods.  Her cycle is always roughly every 26-30 days. LMP started Dec 14 and lasts normally 5 days. She says this one was a week early.  She says her breasts are tender before her periods and this is common. She says because her sister diagnoses, she does her self breast exam She hasn't tried anything for the pain.  She has had tubal ligation, no chance of pregnancy.  Patient Active Problem List   Diagnosis Date Noted   Closed nondisplaced fracture of medial malleolus of left tibia 10/11/2021   Family history of breast cancer in sister 09/07/2020   Depression with anxiety 08/26/2018   Left ankle sprain 08/26/2018    Review of Systems  Constitutional:  Negative for fever and weight loss.  Musculoskeletal:        Breast pain bilaterally with  left breast lump  All other systems reviewed and are negative.       Objective:    BP 114/71   Pulse 78   Temp 98.6 F (37 C)   Ht 5' 4.5" (1.638 m)   Wt 195 lb 5 oz (88.6 kg)   SpO2 98%   BMI 33.01 kg/m    Physical Exam Vitals and nursing note reviewed. Exam conducted with a chaperone present.  Constitutional:      Appearance: Normal appearance. She is normal weight.  HENT:     Head: Normocephalic and atraumatic.     Right Ear: External ear normal.     Left Ear: External ear normal.     Nose: Nose normal.     Mouth/Throat:     Pharynx: Oropharynx is clear.  Eyes:     Conjunctiva/sclera: Conjunctivae normal.  Cardiovascular:     Rate and Rhythm: Normal rate.  Pulmonary:     Effort: Pulmonary effort is normal.  Musculoskeletal:        General: Tenderness present.     Comments: Bilateral breast pain with light palpation, cystic lumps in bilateral breasts.  Skin:    Capillary Refill: Capillary refill takes less than 2 seconds.  Neurological:     General: No focal deficit present.     Mental Status:  She is alert and oriented to person, place, and time. Mental status is at baseline.  Psychiatric:        Mood and Affect: Mood normal.        Behavior: Behavior normal.        Thought Content: Thought content normal.        Judgment: Judgment normal.     No results found for any visits on 07/10/22.      Assessment & Plan:   Problem List Items Addressed This Visit   None Visit Diagnoses     Cyclical breast pain    -  Primary   Relevant Medications   naproxen (NAPROSYN) 500 MG tablet   Other Relevant Orders   MM Digital Diagnostic Bilat   Mass overlapping multiple quadrants of left breast       Relevant Orders   MM Digital Diagnostic Bilat     Appears to have cyclical breast pain, worse around menstrual cycle.  With strong family hx of breast cancer in her sister in her 4s, will send for diagnostic mammogram. Reviewed previous mammogram from 71. Pt may  benefit from routine mammogram screening. Will add NSAIDs to use bid prn for pain.  Meds ordered this encounter  Medications   naproxen (NAPROSYN) 500 MG tablet    Sig: Take 1 tablet (500 mg total) by mouth 2 (two) times daily as needed.    Dispense:  60 tablet    Refill:  0    Return if symptoms worsen or fail to improve.  Leeanne Rio, MD

## 2022-07-10 NOTE — Addendum Note (Signed)
Addended by: Leeanne Rio on: 07/10/2022 11:19 AM   Modules accepted: Orders

## 2022-07-20 ENCOUNTER — Telehealth: Payer: Self-pay | Admitting: Family Medicine

## 2022-07-20 DIAGNOSIS — N644 Mastodynia: Secondary | ICD-10-CM

## 2022-07-20 DIAGNOSIS — N6325 Unspecified lump in the left breast, overlapping quadrants: Secondary | ICD-10-CM

## 2022-07-20 NOTE — Telephone Encounter (Signed)
Patient informed. 

## 2022-07-20 NOTE — Telephone Encounter (Signed)
Patient called checking status of Mammogram. She is concerned that no one has called her in regards to this concern. I informed patient it was done on 07/10/2022.

## 2022-07-20 NOTE — Telephone Encounter (Signed)
I have placed another order, this time stat. If she doesn't get called by Monday, let us know.

## 2022-07-21 ENCOUNTER — Other Ambulatory Visit: Payer: Self-pay | Admitting: Family Medicine

## 2022-07-21 DIAGNOSIS — N644 Mastodynia: Secondary | ICD-10-CM

## 2022-07-21 DIAGNOSIS — N6325 Unspecified lump in the left breast, overlapping quadrants: Secondary | ICD-10-CM

## 2022-07-25 ENCOUNTER — Ambulatory Visit: Payer: 59 | Admitting: Family Medicine

## 2022-07-29 ENCOUNTER — Ambulatory Visit
Admission: RE | Admit: 2022-07-29 | Discharge: 2022-07-29 | Disposition: A | Discharge status: Home / Self Care | Payer: 59 | Source: Ambulatory Visit | Attending: Family Medicine | Admitting: Family Medicine

## 2022-07-29 ENCOUNTER — Other Ambulatory Visit: Payer: Self-pay | Admitting: Family Medicine

## 2022-07-29 DIAGNOSIS — N6489 Other specified disorders of breast: Secondary | ICD-10-CM

## 2022-07-29 DIAGNOSIS — N6310 Unspecified lump in the right breast, unspecified quadrant: Secondary | ICD-10-CM

## 2022-07-29 DIAGNOSIS — N644 Mastodynia: Secondary | ICD-10-CM

## 2022-07-29 DIAGNOSIS — N6325 Unspecified lump in the left breast, overlapping quadrants: Secondary | ICD-10-CM

## 2022-08-08 ENCOUNTER — Ambulatory Visit
Admission: RE | Admit: 2022-08-08 | Discharge: 2022-08-08 | Disposition: A | Discharge status: Home / Self Care | Payer: 59 | Source: Ambulatory Visit | Attending: Family Medicine | Admitting: Family Medicine

## 2022-08-08 DIAGNOSIS — N6489 Other specified disorders of breast: Secondary | ICD-10-CM

## 2022-08-08 DIAGNOSIS — N6310 Unspecified lump in the right breast, unspecified quadrant: Secondary | ICD-10-CM

## 2022-08-08 HISTORY — PX: BREAST BIOPSY: SHX20

## 2022-09-01 ENCOUNTER — Other Ambulatory Visit: Payer: Self-pay | Admitting: Family Medicine

## 2022-09-01 DIAGNOSIS — N644 Mastodynia: Secondary | ICD-10-CM

## 2022-10-16 ENCOUNTER — Encounter: Payer: Self-pay | Admitting: *Deleted

## 2023-03-09 ENCOUNTER — Ambulatory Visit: Payer: 59 | Admitting: Family Medicine

## 2023-03-09 ENCOUNTER — Encounter: Payer: Self-pay | Admitting: Family Medicine

## 2023-03-09 VITALS — BP 117/75 | HR 75 | Temp 98.8°F | Resp 20 | Ht 64.5 in | Wt 195.1 lb

## 2023-03-09 DIAGNOSIS — N912 Amenorrhea, unspecified: Secondary | ICD-10-CM

## 2023-03-09 LAB — POCT URINE PREGNANCY: Preg Test, Ur: NEGATIVE

## 2023-03-09 NOTE — Progress Notes (Signed)
Acute Office Visit  Subjective:     Patient ID: Paige Torres, female    DOB: June 04, 1986, 37 y.o.   MRN: [id number]  Chief Complaint  Patient presents with   Amenorrhea    Patient states that she her period as of today is 15 days late    HPI Patient is in today for acute visit.   Pt reports hx of normal periods. She is now 15 days late. She reports she should've had a period on Aug 22nd. She has also noticed spotting that started on Aug 22nd and then stopped. She has been cramping and having LLQ pain that is sharp. She started spotting Monday and Tuesday of this week. She has been cramping since then. She has had tubes tied 37 y.o. She does report hair shedding that is normal for her during summer time and isn't out She does have family hx of prediabetes in her father and has an aunt with diabetes and kidney failure. She recently passed a month ago.   Review of Systems  Genitourinary:        Left sided pelvic pain  All other systems reviewed and are negative.      Objective:    BP 117/75   Pulse 75   Temp 98.8 F (37.1 C) (Oral)   Resp 20   Ht 5' 4.5" (1.638 m)   Wt 195 lb 1.6 oz (88.5 kg)   SpO2 97%   BMI 32.97 kg/m    Physical Exam Vitals and nursing note reviewed.  Constitutional:      Appearance: Normal appearance. She is normal weight.  HENT:     Head: Normocephalic and atraumatic.     Nose: Nose normal.     Mouth/Throat:     Mouth: Mucous membranes are moist.     Pharynx: Oropharynx is clear.  Eyes:     Conjunctiva/sclera: Conjunctivae normal.     Pupils: Pupils are equal, round, and reactive to light.  Cardiovascular:     Rate and Rhythm: Normal rate and regular rhythm.     Pulses: Normal pulses.     Heart sounds: Normal heart sounds.  Pulmonary:     Effort: Pulmonary effort is normal.     Breath sounds: Normal breath sounds.  Abdominal:     General: Abdomen is flat. Bowel sounds are normal.  Skin:    General: Skin is warm.     Capillary  Refill: Capillary refill takes less than 2 seconds.  Neurological:     General: No focal deficit present.     Mental Status: She is alert and oriented to person, place, and time. Mental status is at baseline.  Psychiatric:        Mood and Affect: Mood normal.        Behavior: Behavior normal.        Thought Content: Thought content normal.        Judgment: Judgment normal.   No results found for any visits on 03/09/23.      Assessment & Plan:   Problem List Items Addressed This Visit   None Visit Diagnoses     Amenorrhea    -  Primary   Relevant Orders   POCT urine pregnancy      Amenorrhea -     POCT urine pregnancy -     Hemoglobin A1c -     TSH -     T4, free -     US PELVIC COMPLETE WITH TRANSVAGINAL; Future  Urine pregnancy negative Will proceed with glucose intolerance and thyroid testing. To return on Monday for labs. Send for TVUS to evaluate for possible ovarian cysts? No orders of the defined types were placed in this encounter.   No follow-ups on file.  Suzan Slick, MD

## 2023-03-13 ENCOUNTER — Ambulatory Visit (HOSPITAL_BASED_OUTPATIENT_CLINIC_OR_DEPARTMENT_OTHER): Payer: 59

## 2023-03-13 LAB — TSH: TSH: 2.43 u[IU]/mL (ref 0.450–4.500)

## 2023-03-13 LAB — HEMOGLOBIN A1C
Est. average glucose Bld gHb Est-mCnc: 100 mg/dL
Hgb A1c MFr Bld: 5.1 % (ref 4.8–5.6)

## 2023-03-13 LAB — T4, FREE: Free T4: 1.15 ng/dL (ref 0.82–1.77)

## 2023-03-27 ENCOUNTER — Encounter: Payer: Self-pay | Admitting: Obstetrics and Gynecology

## 2023-03-27 ENCOUNTER — Other Ambulatory Visit (HOSPITAL_COMMUNITY)
Admission: RE | Admit: 2023-03-27 | Discharge: 2023-03-27 | Disposition: A | Discharge status: Home / Self Care | Payer: 59 | Source: Ambulatory Visit | Attending: Obstetrics and Gynecology | Admitting: Obstetrics and Gynecology

## 2023-03-27 ENCOUNTER — Ambulatory Visit: Payer: 59 | Admitting: Obstetrics and Gynecology

## 2023-03-27 VITALS — BP 118/83 | HR 69 | Ht 64.5 in | Wt 196.0 lb

## 2023-03-27 DIAGNOSIS — N926 Irregular menstruation, unspecified: Secondary | ICD-10-CM

## 2023-03-27 NOTE — Progress Notes (Unsigned)
   GYNECOLOGY PROGRESS NOTE  History:  37 y.o. G1P1001 presents to [facility] office today for problem gyn visit. She reports needs a new GYN, 3 weeks behind on period. Finally started cycle. Normal 27-30 day cycle,  lasting 5-6 days.  during time that it was late would get left lower quadrant pain. Has had a BTL 13 years ago. PCP drew thyroid and hgba1c - The following portions of the patient's history were reviewed and updated as appropriate: allergies, current medications, past family history, past medical history, past social history, past surgical history and problem list. Last pap smear on 09/07/20 was normal, normal HRHPV.  Health Maintenance Due  Topic Date Due   HIV Screening  Never done   Hepatitis C Screening  Never done   DTaP/Tdap/Td (1 - Tdap) Never done   INFLUENZA VACCINE  Never done   COVID-19 Vaccine (1 - 2023-24 season) Never done     Review of Systems:  Pertinent items are noted in HPI.   Objective:  Physical Exam Blood pressure 118/83, pulse 69, height 5' 4.5" (1.638 m), weight 196 lb (88.9 kg), last menstrual period 03/16/2023. VS reviewed, nursing note reviewed,  Constitutional: well developed, well nourished, no distress HEENT: normocephalic CV: normal rate Pulm/chest wall: normal effort Breast Exam: deferred Abdomen: soft  Neuro: alert and oriented x 3 Skin: warm, dry Psych: affect normal Pelvic exam: deferred  Assessment & Plan:  1. Irregular menstrual cycle Reviewed labs collected at PCP Discussed causes of irregular cycle, stress, hormonal change, bacterial changes. Will check swab today  Monitor cycle over next couple of months and will follow up to assess cycle - Cervicovaginal ancillary only  Return in 3 months for follow up   Albertine Grates, FNP

## 2023-03-29 LAB — CERVICOVAGINAL ANCILLARY ONLY
Bacterial Vaginitis (gardnerella): NEGATIVE
Candida Glabrata: NEGATIVE
Candida Vaginitis: NEGATIVE
Comment: NEGATIVE
Comment: NEGATIVE
Comment: NEGATIVE

## 2024-01-20 IMAGING — DX DG ANKLE COMPLETE 3+V*L*
3 series · 3 of 3 positions shown · non-contrast
Comparison: None.

CLINICAL DATA: 35-year-old female with a history of left ankle
injury and prior history of fracture

EXAM:
LEFT ANKLE COMPLETE - 3+ VIEW

[ankle ap]
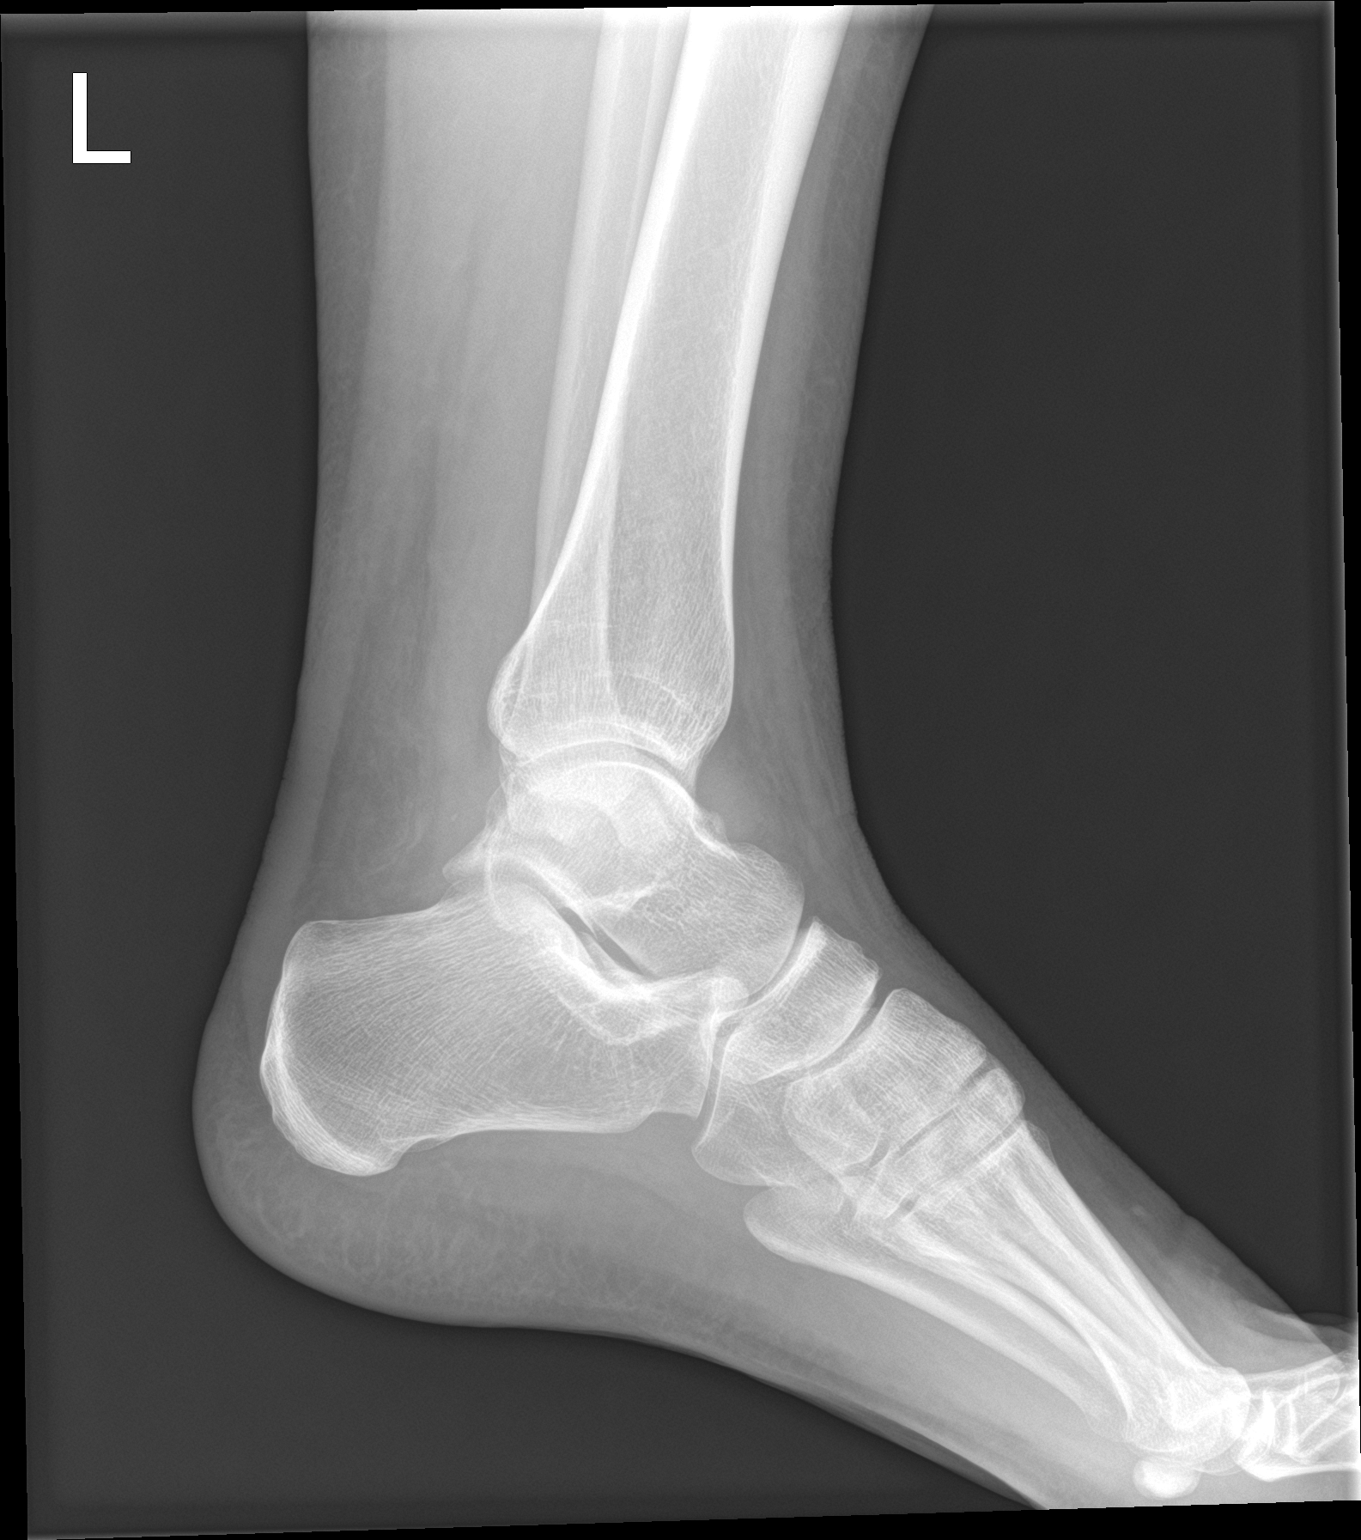

[ankle obl]
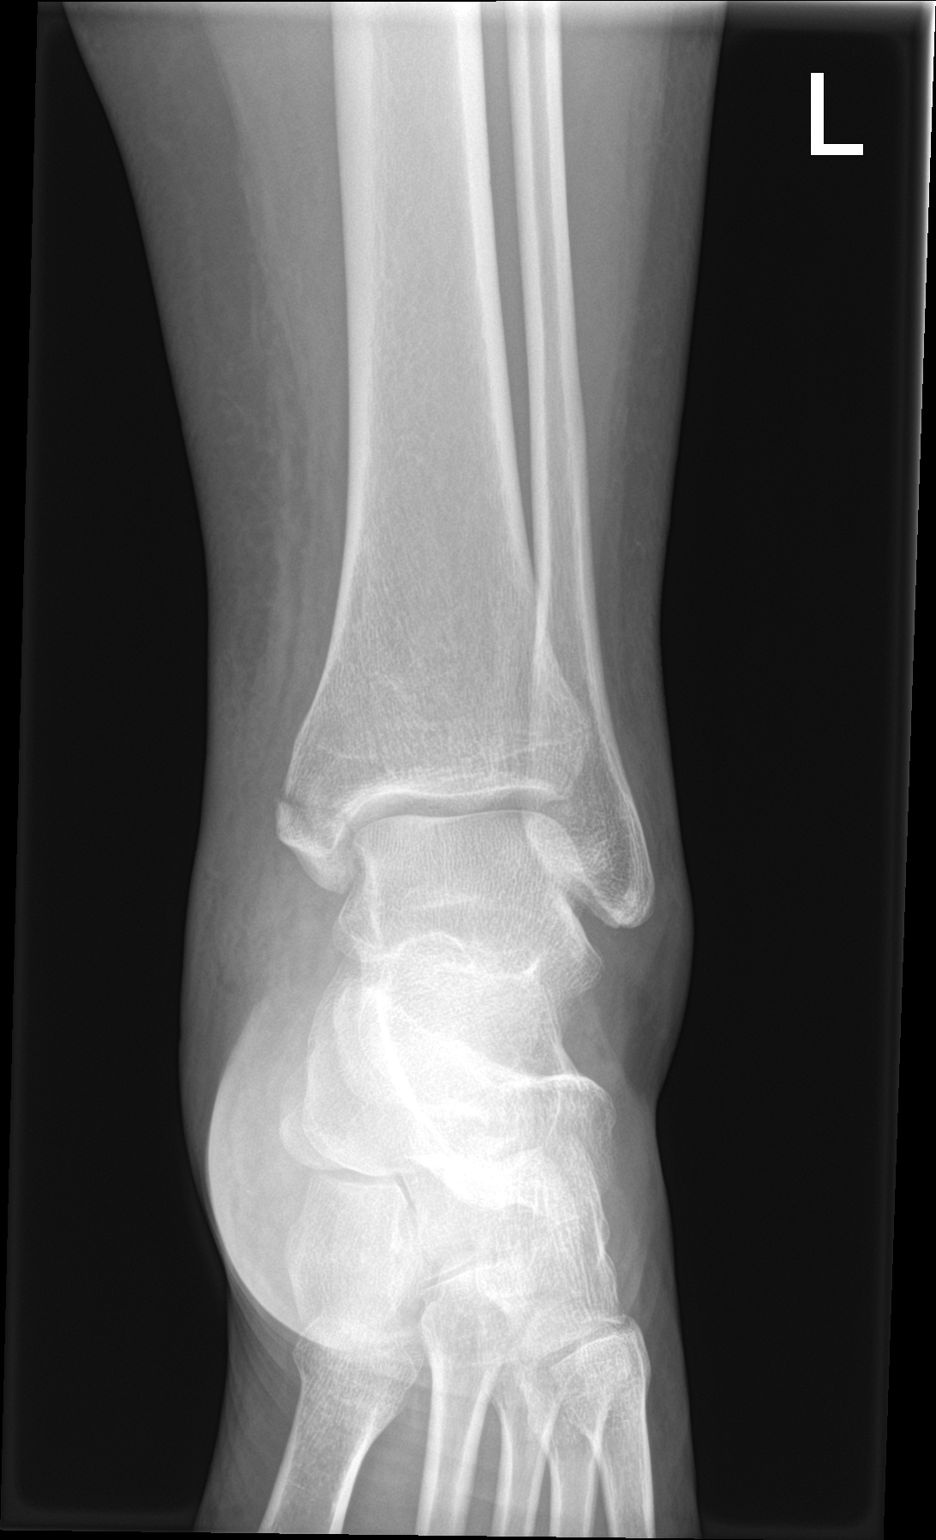

[ankle lat]
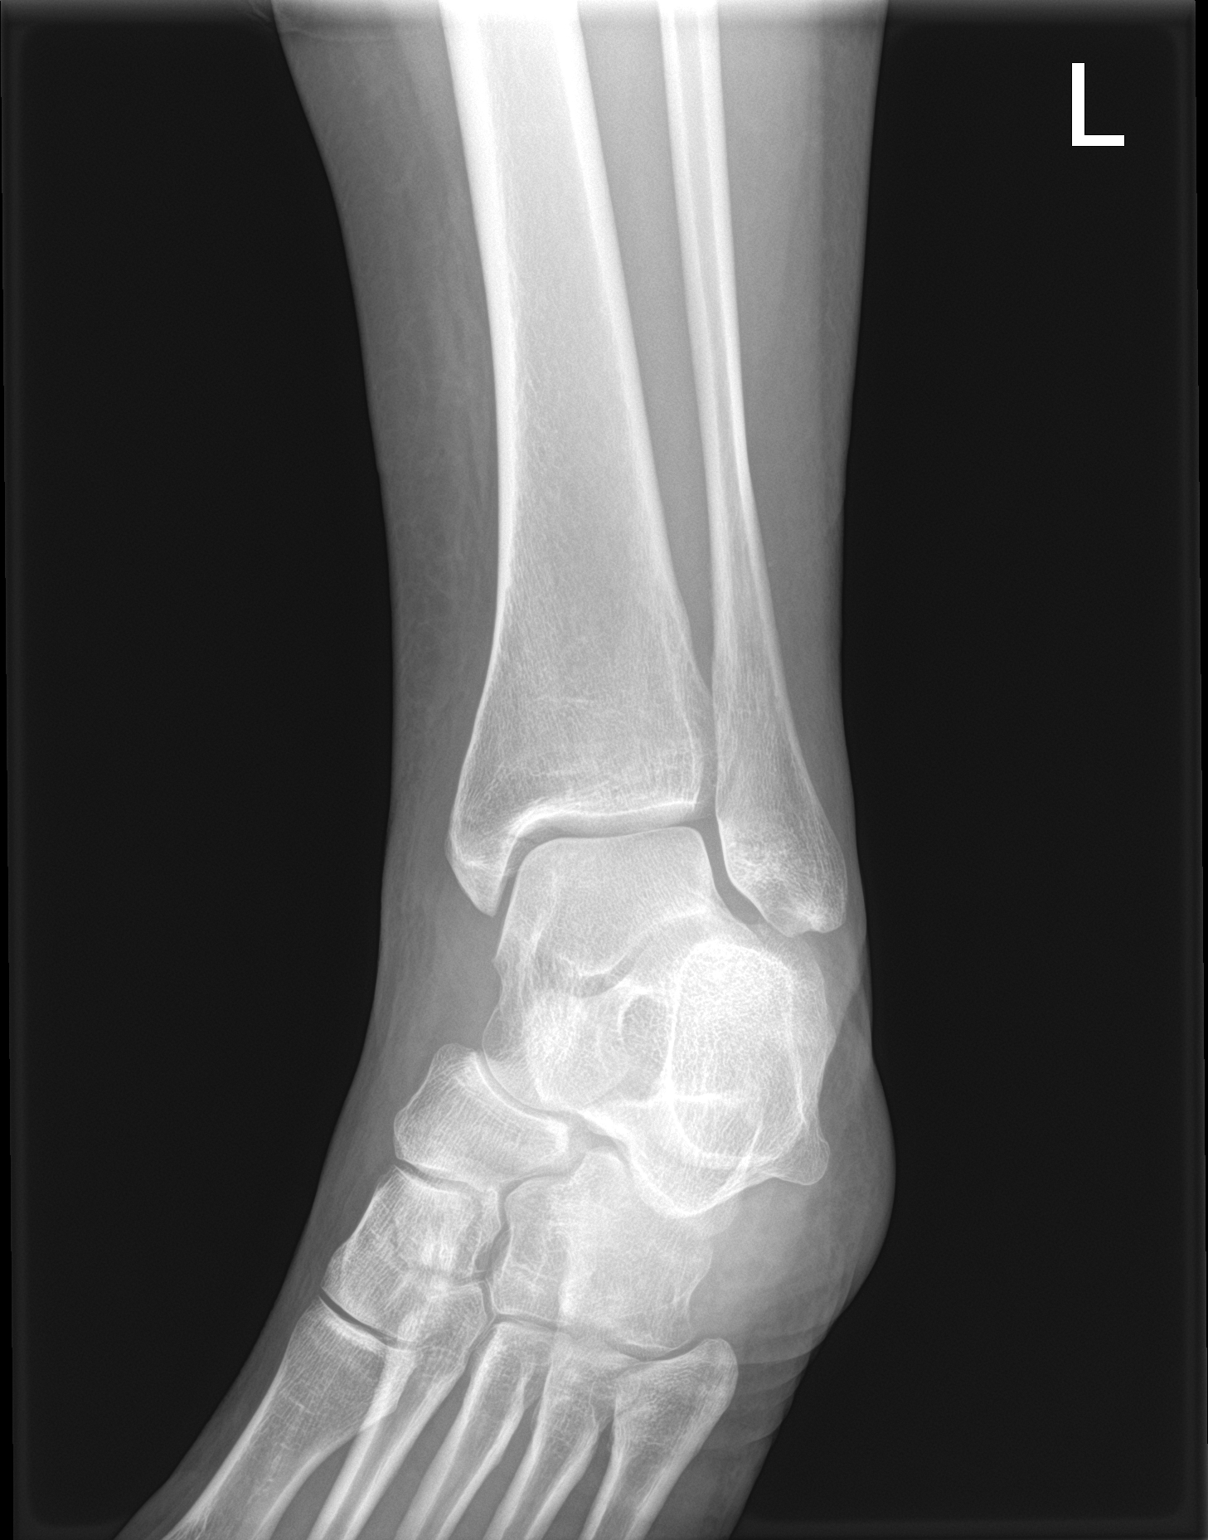

[3 of 3 positions shown; findings below may reference images not displayed]

FINDINGS: Acute fracture of the medial malleolus. Ankle mortise appears
congruent. Joint effusion at the tibiotalar joint. Soft tissue
swelling along the medial ankle.
IMPRESSION: Acute fracture at the medial malleolus with associated joint
effusion and soft tissue swelling

## 2024-03-04 ENCOUNTER — Encounter: Payer: Self-pay | Admitting: Sports Medicine

## 2024-04-29 ENCOUNTER — Ambulatory Visit: Payer: Self-pay | Admitting: Obstetrics and Gynecology

## 2024-04-29 ENCOUNTER — Encounter: Payer: Self-pay | Admitting: Obstetrics and Gynecology

## 2024-04-29 ENCOUNTER — Other Ambulatory Visit (HOSPITAL_COMMUNITY)
Admission: RE | Admit: 2024-04-29 | Discharge: 2024-04-29 | Disposition: A | Discharge status: Home / Self Care | Source: Ambulatory Visit | Attending: Obstetrics and Gynecology | Admitting: Obstetrics and Gynecology

## 2024-04-29 VITALS — BP 122/78 | HR 67 | Ht 64.5 in | Wt 183.0 lb

## 2024-04-29 DIAGNOSIS — Z01419 Encounter for gynecological examination (general) (routine) without abnormal findings: Secondary | ICD-10-CM

## 2024-04-29 DIAGNOSIS — Z803 Family history of malignant neoplasm of breast: Secondary | ICD-10-CM

## 2024-04-29 DIAGNOSIS — Z113 Encounter for screening for infections with a predominantly sexual mode of transmission: Secondary | ICD-10-CM

## 2024-04-29 DIAGNOSIS — Z23 Encounter for immunization: Secondary | ICD-10-CM | POA: Diagnosis not present

## 2024-04-29 NOTE — Progress Notes (Signed)
 GYNECOLOGY ANNUAL PREVENTATIVE CARE ENCOUNTER NOTE  History:     Paige Torres is a 38 y.o. G39P1001 female here for a routine annual gynecologic exam.  Current complaints: None.   Denies abnormal vaginal bleeding, discharge, pelvic pain, problems with intercourse or other gynecologic concerns.     Ex partner with concern for genital warts, no confirmation.  Interested in gardacil   Gynecologic History Patient's last menstrual period was 04/22/2024 (exact date). Contraception: tubal ligation Last Pap: 2022. Result was normal with negative HPV Last Mammogram: 2024 with biopsy with normal results..   Sister with non genetic Bc at age 70, S/P bilateral mastectomy.  Last Colonoscopy: NA  Obstetric History OB History  Gravida Para Term Preterm AB Living  1 1 1   1   SAB IAB Ectopic Multiple Live Births      1    # Outcome Date GA Lbr Len/2nd Weight Sex Type Anes PTL Lv  1 Term 04/20/10    M Vag-Spont   LIV    Past Medical History:  Diagnosis Date   Depression with anxiety 08/26/2018   Left ankle sprain 08/26/2018    Past Surgical History:  Procedure Laterality Date   BREAST BIOPSY Right 08/08/2022   US  RT BREAST BX W LOC DEV 1ST LESION IMG BX SPEC US  GUIDE 08/08/2022 [facility]   BREAST BIOPSY Right 08/08/2022   MM RT BREAST BX W LOC DEV 1ST LESION IMAGE BX SPEC STEREO GUIDE 08/08/2022 [facility]   KNEE ARTHROSCOPY Bilateral    r hand     TONSILLECTOMY     TUBAL LIGATION      No current outpatient medications on file prior to visit.   No current facility-administered medications on file prior to visit.    Allergies  Allergen Reactions   Codeine Other (See Comments)    Mental status    Social History:  reports that she has never smoked. She has never used smokeless tobacco. She reports that she does not currently use alcohol after a past usage of about 2.0 standard drinks of alcohol per week. She reports that she does not use drugs.  Family History   Problem Relation Age of Onset   Arthritis Mother    Diabetes Father    Sleep apnea Father    Breast cancer Sister 48       tested and not genetic, patient had previous mammo normal exapt increased breast density   Breast cancer Paternal Aunt    Stroke Maternal Grandmother    Hypertension Maternal Grandmother    Cancer - Other Paternal Great-grandmother 51       breast    The following portions of the patient's history were reviewed and updated as appropriate: allergies, current medications, past family history, past medical history, past social history, past surgical history and problem list.  Review of Systems Pertinent items noted in HPI and remainder of comprehensive ROS otherwise negative.  Physical Exam:  BP 122/78   Pulse 67   Ht 5' 4.5 (1.638 m)   Wt 183 lb (83 kg)   LMP 04/22/2024 (Exact Date)   BMI 30.93 kg/m  CONSTITUTIONAL: Well-developed, well-nourished female in no acute distress.  HENT:  Normocephalic, atraumatic, External right and left ear normal.  EYES: Conjunctivae and EOM are normal. Pupils are equal, round, and reactive to light. No scleral icterus.  NECK: Normal range of motion, supple, no masses.  Normal thyroid .  SKIN: Skin is warm and dry. No rash noted. Not diaphoretic. No  erythema. No pallor. MUSCULOSKELETAL: Normal range of motion. No tenderness.  No cyanosis, clubbing, or edema. NEUROLOGIC: Alert and oriented to person, place, and time. Normal reflexes, muscle tone coordination.  PSYCHIATRIC: Normal mood and affect. Normal behavior. Normal judgment and thought content. CARDIOVASCULAR: Normal heart rate noted, regular rhythm RESPIRATORY: Clear to auscultation bilaterally. Effort and breath sounds normal, no problems with respiration noted. BREASTS: Symmetric in size. No masses, tenderness, skin changes, nipple drainage, or lymphadenopathy bilaterally. Performed in the presence of a chaperone. ABDOMEN: Soft, no distention noted.  No tenderness,  rebound or guarding.  PELVIC: Normal appearing external genitalia and urethral meatus; normal appearing vaginal mucosa and cervix.  No abnormal vaginal discharge noted.  Pap smear obtained.  Normal uterine size, no other palpable masses, no uterine or adnexal tenderness.  Performed in the presence of a chaperone.   Assessment and Plan:    1. Screening examination for STD (sexually transmitted disease) (Primary)  - RPR+HBsAg+HCVAb+... - Cytology - PAP  2. Well woman exam with routine gynecological exam - RPR+HBsAg+HCVAb+... - Cytology - PAP - HPV 9-valent vaccine,Recombinat  3. Need for HPV vaccination  - HPV 9-valent vaccine,Recombinat  Will follow up results of pap smear and manage accordingly. Normal breast examination today, she was advised to perform periodic self breast examinations.  Mammogram scheduled for breast cancer screening. Routine preventative health maintenance measures emphasized. Please refer to After Visit Summary for other counseling recommendations.     Lamone Ferrelli, Delon FERNS, NP [facility] [employer], [facility]

## 2024-05-02 LAB — CYTOLOGY - PAP
Chlamydia: NEGATIVE
Comment: NEGATIVE
Comment: NEGATIVE
Comment: NEGATIVE
Comment: NORMAL
Diagnosis: NEGATIVE
High risk HPV: NEGATIVE
Neisseria Gonorrhea: NEGATIVE
Trichomonas: NEGATIVE

## 2024-05-06 ENCOUNTER — Ambulatory Visit: Payer: Self-pay | Admitting: Obstetrics and Gynecology

## 2024-06-30 ENCOUNTER — Ambulatory Visit

## 2024-06-30 VITALS — BP 115/80 | HR 76 | Ht 65.0 in | Wt 180.0 lb

## 2024-06-30 DIAGNOSIS — Z23 Encounter for immunization: Secondary | ICD-10-CM

## 2024-06-30 NOTE — Progress Notes (Signed)
 Paige Torres is here for their 2nd Gardasil injection. Pt denies any issues at this time. Pt tolerated injection well. To follow up in 4-5 months for next injection.   1st Gardasil 04/29/2024 2nd Gardasil: today, given by Silvano Piano, RN in Left Deltoid.  Silvano LELON Piano, RN

## 2024-07-15 ENCOUNTER — Ambulatory Visit

## 2024-07-15 ENCOUNTER — Ambulatory Visit: Payer: Self-pay | Admitting: Family Medicine

## 2024-07-15 ENCOUNTER — Encounter: Payer: Self-pay | Admitting: Family Medicine

## 2024-07-15 ENCOUNTER — Ambulatory Visit (INDEPENDENT_AMBULATORY_CARE_PROVIDER_SITE_OTHER): Admitting: Family Medicine

## 2024-07-15 VITALS — BP 106/64 | HR 63 | Temp 98.9°F | Ht 65.0 in | Wt 179.0 lb

## 2024-07-15 DIAGNOSIS — M25552 Pain in left hip: Secondary | ICD-10-CM | POA: Diagnosis not present

## 2024-07-15 DIAGNOSIS — F419 Anxiety disorder, unspecified: Secondary | ICD-10-CM

## 2024-07-15 DIAGNOSIS — Z1329 Encounter for screening for other suspected endocrine disorder: Secondary | ICD-10-CM | POA: Diagnosis not present

## 2024-07-15 MED ORDER — VENLAFAXINE HCL ER 37.5 MG PO CP24: 37.5000 mg | ORAL_CAPSULE | Freq: Every day | ORAL | 0 refills | Status: AC

## 2024-07-15 NOTE — Progress Notes (Signed)
 "  Established Patient Office Visit  Subjective   Patient ID: Paige Torres, female    DOB: 1986-05-20  Age: 39 y.o. MRN: [id number]  Chief Complaint  Patient presents with   Hip Pain    LEFT - started going to gym in July - does heavy lifting pain onset mid 2025/06/05 - does not recall hearing any sort of pop - sitting long periods of time cause pain -once sitting for more than 15 min there is a pop that she feels Hurts a lot when in a lunge position with left leg to the back   Anxiety    Pt states she was previously on lexapro  for depression and it also seemed to help with her anxiety. Recently patient states her anxiety is causes difficulty in her day to day and would like to talk about a plan moving forward    Hip Pain   Anxiety Symptoms include nervous/anxious behavior.     Left hip pain Pt reports left hip pain and popping after standing from sitting position. Painful when it pops. Started going to the gym in July and pain started in 06/05/25. She is doing lunges, dead lifts, squats. Didn't notice any specific incident but going to the gym makes it hurt worst. She hasn't gone in the last 3 weeks and is still painful. It is painful just walking when the right leg is forward. Pt taking Ibuprofen  not helping.   Anxiety She reports a hx of anxiety since August 2025. She use to be on Lexapro  for depression/anxiety. She felt like it was situational and stopped it. Pt has 39 y.o and worried about him driving to the point she gets nervous. She has episodes of heart racing not knowing what will happen. She says this is regular.  Flowsheet Row Office Visit from 07/15/2024 in [facility]  PHQ-9 Total Score 6       07/15/2024    9:45 AM 04/29/2024    4:19 PM 01/19/2022    1:47 PM 05/25/2020    8:23 AM  GAD 7 : Generalized Anxiety Score  Nervous, Anxious, on Edge 3 1 1 1   Control/stop worrying 2 0 0 1  Worry too much - different things 2 0 0 1  Trouble  relaxing 2 1 0 1  Restless 1 0 0 0  Easily annoyed or irritable 2 1 1 1   Afraid - awful might happen 1 0 0 0  Total GAD 7 Score 13 3 2 5   Anxiety Difficulty Very difficult  Not difficult at all Somewhat difficult       Review of Systems  Musculoskeletal:  Positive for joint pain.  Psychiatric/Behavioral:  The patient is nervous/anxious.   All other systems reviewed and are negative.     Objective:     BP 106/64 (BP Location: Left Arm, Patient Position: Sitting, Cuff Size: Normal)   Pulse 63   Temp 98.9 F (37.2 C) (Oral)   Ht 5' 5 (1.651 m)   Wt 179 lb (81.2 kg)   LMP 06/16/2024 (Exact Date)   SpO2 98%   BMI 29.79 kg/m    Physical Exam Vitals and nursing note reviewed.  Constitutional:      Appearance: Normal appearance. She is normal weight.  HENT:     Head: Normocephalic and atraumatic.     Right Ear: External ear normal.     Left Ear: External ear normal.     Nose: Nose normal.     Mouth/Throat:  Mouth: Mucous membranes are moist.     Pharynx: Oropharynx is clear.  Eyes:     Conjunctiva/sclera: Conjunctivae normal.     Pupils: Pupils are equal, round, and reactive to light.  Cardiovascular:     Rate and Rhythm: Normal rate and regular rhythm.  Pulmonary:     Effort: Pulmonary effort is normal.  Abdominal:     General: Abdomen is flat. Bowel sounds are normal.  Skin:    General: Skin is warm.     Capillary Refill: Capillary refill takes less than 2 seconds.  Neurological:     General: No focal deficit present.     Mental Status: She is alert and oriented to person, place, and time. Mental status is at baseline.  Psychiatric:        Mood and Affect: Mood normal.        Behavior: Behavior normal.        Thought Content: Thought content normal.        Judgment: Judgment normal.     No results found for any visits on 07/15/24.    The ASCVD Risk score (Arnett DK, et al., 2019) failed to calculate for the following reasons:   The 2019 ASCVD risk  score is only valid for ages 86 to 63   * - Cholesterol units were assumed    Assessment & Plan:   Problem List Items Addressed This Visit   None  Left hip pain -     DG HIP UNILAT W OR W/O PELVIS 2-3 VIEWS LEFT; Future  Anxiety -     Venlafaxine  HCl ER; Take 1 capsule (37.5 mg total) by mouth daily with breakfast.  Dispense: 30 capsule; Refill: 0  Screening for thyroid  disorder -     TSH + free T4   Pt with left hip pain and popping. Send for xrays. Continue Ibuprofen  for now.  Pt with anxiety worsening without treatment . Screen for thyroid  and start Effexor  37.5mg  daily. See back in 4 weeks.  No follow-ups on file.    Torrence CINDERELLA Barrier, MD  "

## 2024-07-16 LAB — TSH+FREE T4
Free T4: 1.1 ng/dL (ref 0.82–1.77)
TSH: 1.1 u[IU]/mL (ref 0.450–4.500)

## 2024-07-29 ENCOUNTER — Other Ambulatory Visit: Payer: Self-pay

## 2024-07-29 ENCOUNTER — Encounter: Payer: Self-pay | Admitting: Sports Medicine

## 2024-07-29 ENCOUNTER — Ambulatory Visit: Admitting: Sports Medicine

## 2024-07-29 DIAGNOSIS — M25552 Pain in left hip: Secondary | ICD-10-CM | POA: Diagnosis not present

## 2024-07-29 DIAGNOSIS — R294 Clicking hip: Secondary | ICD-10-CM | POA: Diagnosis not present

## 2024-07-29 DIAGNOSIS — G8929 Other chronic pain: Secondary | ICD-10-CM | POA: Diagnosis not present

## 2024-07-29 MED ORDER — METHYLPREDNISOLONE ACETATE 40 MG/ML IJ SUSP
80.0000 mg | INTRAMUSCULAR | Status: AC | PRN
  Administered 2024-07-29: 80 mg via INTRA_ARTICULAR

## 2024-07-29 MED ORDER — LIDOCAINE HCL 1 % IJ SOLN
4.0000 mL | INTRAMUSCULAR | Status: AC | PRN
  Administered 2024-07-29: 4 mL

## 2024-07-29 NOTE — Progress Notes (Signed)
 "  Paige Torres - 39 y.o. female MRN [id number]  Date of birth: 01/06/1986  Office Visit Note: Visit Date: 07/29/2024 PCP: Colette Torrence GRADE, MD Referred by: Colette Torrence GRADE, MD  Medical Resident, Sports Medicine Fellow - Attending Physician Addendum:   I have independently interviewed and examined the patient myself. I have discussed the above with the original author and agree with their documentation. My edits for correction/addition/clarification have been made, see any changes above and below.   In summary, 39 year-old female with chronic left hip pain with symptoms and PE very suggestive of hip labral pathology.  Discussed options including formalized physical therapy, oral medications, injection therapy, advanced imaging such as MRI. Through shared decision making, did proceed with ultrasound-guided left hip intra-articular injection for both diagnostic and therapeutic purposes, patient tolerated well.  May use ice/heat as well as over-the-counter anti-inflammatories for any postinjection pain.  We will get her started in formalized physical therapy and see how much her pain is relieved with returning to physical activity in the coming weeks.  We will follow-up in 6 weeks for reevaluation.  Could consider MRI arthrogram  Paige Sprang, DO Primary Care Sports Medicine Physician  [facility] - [facility]   Subjective: Chief Complaint  Patient presents with   Left Hip - Pain   HPI: Paige Torres is a pleasant 39 y.o. female who presents today for evaluation of left hip pain.  Patient says pain started roughly 2 weeks prior to Thanksgiving.  She got back into resistance training with heavy squats, dead lift, and other free weight lifts this summer.  Does not recall any specific injury regarding her left hip prior to pain originating.  Initially tried to work through pain in gym but pain got worse and worse.  She has taken the last 5 weeks off from the gym and does endorse some  mild improvement of symptoms.  She is taking ibuprofen  for pain but this only helped mildly.  Since pain has started has been having intermittent popping/locking of her hip joint that causes pain when going from seated to standing position after prolonged sitting episode.  Pertinent ROS were reviewed with the patient and found to be negative unless otherwise specified above in HPI.   Assessment & Plan: Visit Diagnoses:  1. Chronic left hip pain   2. 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  Patient elected to proceed with ultrasound-guided left intra-articular hip injection today.  Procedure went well.  See full procedure note below.  We also sent patient referral to physical therapy.  Patient will start physical therapy and will follow up with myself  to discuss symptoms in 6 weeks. If symptoms not adequately managed at follow up will consider MRI for further evaluation.  Patient understands and agrees to treatment plan.  Follow-up: Return in about 6 weeks (around 09/09/2024) for Left hip/labral f/u .   Meds & Orders: No orders of the defined types were placed in this encounter.   Orders Placed This Encounter  Procedures   US  Guided Needle Placement - No Linked Charges     Procedures: Large Joint Inj: L hip joint on 07/29/2024 3:11 PM Indications: pain Details: 22 G 3.5 in needle, ultrasound-guided anterior approach Medications: 4 mL lidocaine  1 %; 80 mg methylPREDNISolone  acetate 40 MG/ML Outcome: tolerated well, no immediate complications  Procedure: US -guided intra-articular hip injection, Left After discussion on risks/benefits/indications and informed verbal consent was obtained, a  timeout was performed. Patient was lying supine on exam table. The hip was cleaned with betadine and alcohol swabs. Then utilizing ultrasound guidance, the patient's femoral head and neck junction was identified and subsequently injected with 4:2  lidocaine :depomedrol via an in-plane approach with ultrasound visualization of the injectate administered into the hip joint. Patient tolerated procedure well without immediate complications.  *Procedure performed by myself, Dr. Burnetta  Procedure, treatment alternatives, risks and benefits explained, specific risks discussed. Consent was given by the patient. Immediately prior to procedure a time out was called to verify the correct patient, procedure, equipment, support staff and site/side marked as required. Patient was prepped and draped in the usual sterile fashion.          Clinical History: No specialty comments available.  She reports that she has never smoked. She has never used smokeless tobacco. No results for input(s): HGBA1C, LABURIC in the last 8760 hours.  Objective:   Vital Signs: LMP 06/16/2024 (Exact Date)   Physical Exam  Gen: Well-appearing, in no acute distress; non-toxic CV: Well-perfused. Warm.  Resp: Breathing unlabored on room air; no wheezing. Psych: Fluid speech in conversation; appropriate affect; normal thought process  Ortho Exam Left Hip: - On inspection no evidence of erythema, ecchymosis, or noticeable edema surrounding the left hip.  Patient does not have any tenderness with palpation over greater trochanter of femur or other bony landmarks surrounding the area.  Patient has full range of motion of the left hip but internal rotation and resisted flexion reproduces significant pain.  Patient is neurovascularly intact distally.  Strength 5/5 in bilateral lower extremities.  FADIR positive for pain.  Stinchfield positive for pain.  Imaging: No results found.  Past Medical/Family/Surgical/Social History: Medications & Allergies reviewed per EMR, new medications updated. Patient Active Problem List   Diagnosis Date Noted   Closed nondisplaced fracture of medial malleolus of left tibia 10/11/2021   Family history of breast cancer in sister 09/07/2020    Depression with anxiety 08/26/2018   Left ankle sprain 08/26/2018   Past Medical History:  Diagnosis Date   Depression with anxiety 08/26/2018   Left ankle sprain 08/26/2018   Family History  Problem Relation Age of Onset   Arthritis Mother    Diabetes Father    Sleep apnea Father    Breast cancer Sister 39       tested and not genetic, patient had previous mammo normal exapt increased breast density   Breast cancer Paternal Aunt    Stroke Maternal Grandmother    Hypertension Maternal Grandmother    Cancer - Other Paternal Great-grandmother 60       breast   Past Surgical History:  Procedure Laterality Date   BREAST BIOPSY Right 08/08/2022   US  RT BREAST BX W LOC DEV 1ST LESION IMG BX SPEC US  GUIDE 08/08/2022 [facility]   BREAST BIOPSY Right 08/08/2022   MM RT BREAST BX W LOC DEV 1ST LESION IMAGE BX SPEC STEREO GUIDE 08/08/2022 [facility]   KNEE ARTHROSCOPY Bilateral    r hand     TONSILLECTOMY     TUBAL LIGATION     Social History   Occupational History   Occupation: truck hospital doctor   Occupation: Medical Illustrator  Tobacco Use   Smoking status: Never   Smokeless tobacco: Never  Vaping Use   Vaping status: Never Used  Substance and Sexual Activity   Alcohol use: Not Currently    Alcohol/week: 2.0 standard drinks of alcohol    Types: 2  Glasses of wine per week   Drug use: Never   Sexual activity: Not Currently    Birth control/protection: Surgical    "

## 2024-07-29 NOTE — Progress Notes (Signed)
 Patient says that she has had left hip pain since November. She denies having any known injury, although does mention that she began going to the gym over the summer and was progressively lifting heavier weights. She was noticing increases in her pain after her workouts. Patient says that her hip will pop when going from seated to standing, and that pop is painful. She will have pain while walking, as well. She denies having any pain at rest. Patient says that her pain/popping occurs deep in the front of the hip/groin, and denies any pain around the side or back of the hip. She denies having any symptoms down the leg.

## 2024-07-30 ENCOUNTER — Encounter: Payer: Self-pay | Admitting: Family Medicine

## 2024-07-30 NOTE — Telephone Encounter (Signed)
 Pt states she will await appointment coming up on 08/14/2024 to discuss further.

## 2024-07-30 NOTE — Telephone Encounter (Signed)
 Please advise. I will call pt when a response is relayed to also schedule an appointment if needed.

## 2024-08-04 ENCOUNTER — Ambulatory Visit: Admitting: Rehabilitative and Restorative Service Providers"

## 2024-08-14 ENCOUNTER — Ambulatory Visit: Admitting: Family Medicine

## 2024-09-09 ENCOUNTER — Ambulatory Visit: Admitting: Sports Medicine

## 2024-10-28 ENCOUNTER — Ambulatory Visit
